# Patient Record
Sex: Female | Born: 1950 | Race: White | Hispanic: No | Marital: Married | Smoking: Never smoker
Health system: Southern US, Community
[De-identification: ages and names within clinical notes are randomized; demographics above are authoritative.]

## PROBLEM LIST (undated history)

## (undated) DIAGNOSIS — R011 Cardiac murmur, unspecified: Secondary | ICD-10-CM

## (undated) DIAGNOSIS — E785 Hyperlipidemia, unspecified: Secondary | ICD-10-CM

## (undated) DIAGNOSIS — R42 Dizziness and giddiness: Secondary | ICD-10-CM

## (undated) DIAGNOSIS — K219 Gastro-esophageal reflux disease without esophagitis: Secondary | ICD-10-CM

## (undated) DIAGNOSIS — I82409 Acute embolism and thrombosis of unspecified deep veins of unspecified lower extremity: Secondary | ICD-10-CM

## (undated) DIAGNOSIS — T7840XA Allergy, unspecified, initial encounter: Secondary | ICD-10-CM

## (undated) DIAGNOSIS — I10 Essential (primary) hypertension: Secondary | ICD-10-CM

## (undated) DIAGNOSIS — I4891 Unspecified atrial fibrillation: Secondary | ICD-10-CM

## (undated) DIAGNOSIS — I2699 Other pulmonary embolism without acute cor pulmonale: Secondary | ICD-10-CM

## (undated) DIAGNOSIS — E042 Nontoxic multinodular goiter: Secondary | ICD-10-CM

## (undated) HISTORY — DX: Allergy, unspecified, initial encounter: T78.40XA

## (undated) HISTORY — DX: Essential (primary) hypertension: I10

## (undated) HISTORY — PX: TONSILLECTOMY: SUR1361

## (undated) HISTORY — DX: Acute embolism and thrombosis of unspecified deep veins of unspecified lower extremity: I82.409

## (undated) HISTORY — DX: Cardiac murmur, unspecified: R01.1

## (undated) HISTORY — DX: Gastro-esophageal reflux disease without esophagitis: K21.9

## (undated) HISTORY — DX: Hyperlipidemia, unspecified: E78.5

## (undated) HISTORY — DX: Nontoxic multinodular goiter: E04.2

## (undated) HISTORY — DX: Other pulmonary embolism without acute cor pulmonale: I26.99

---

## 2003-03-18 ENCOUNTER — Encounter: Payer: Self-pay | Admitting: Cardiovascular Disease

## 2003-03-18 ENCOUNTER — Ambulatory Visit (HOSPITAL_COMMUNITY): Admission: RE | Admit: 2003-03-18 | Discharge: 2003-03-18 | Payer: Self-pay | Admitting: Cardiovascular Disease

## 2003-12-06 ENCOUNTER — Ambulatory Visit (HOSPITAL_COMMUNITY): Admission: RE | Admit: 2003-12-06 | Discharge: 2003-12-06 | Payer: Self-pay | Admitting: Cardiovascular Disease

## 2004-11-22 ENCOUNTER — Ambulatory Visit: Payer: Self-pay | Admitting: Family Medicine

## 2005-09-09 ENCOUNTER — Ambulatory Visit: Payer: Self-pay | Admitting: Family Medicine

## 2005-10-23 ENCOUNTER — Ambulatory Visit: Payer: Self-pay | Admitting: Internal Medicine

## 2005-10-29 ENCOUNTER — Ambulatory Visit: Payer: Self-pay | Admitting: Cardiology

## 2005-11-05 ENCOUNTER — Ambulatory Visit: Payer: Self-pay | Admitting: Internal Medicine

## 2005-11-25 HISTORY — PX: COLONOSCOPY: SHX5424

## 2006-04-10 ENCOUNTER — Ambulatory Visit: Payer: Self-pay | Admitting: Family Medicine

## 2007-05-11 ENCOUNTER — Ambulatory Visit: Payer: Self-pay | Admitting: Family Medicine

## 2007-07-03 ENCOUNTER — Ambulatory Visit: Payer: Self-pay | Admitting: Gastroenterology

## 2008-05-09 ENCOUNTER — Ambulatory Visit: Payer: Self-pay | Admitting: Family Medicine

## 2009-05-12 ENCOUNTER — Ambulatory Visit: Payer: Self-pay | Admitting: Family Medicine

## 2009-07-07 HISTORY — PX: DOPPLER ECHOCARDIOGRAPHY: SHX263

## 2009-07-07 HISTORY — PX: TRANSTHORACIC ECHOCARDIOGRAM: SHX275

## 2009-07-07 HISTORY — PX: NM MYOVIEW LTD: HXRAD82

## 2010-05-31 ENCOUNTER — Ambulatory Visit: Payer: Self-pay | Admitting: Family Medicine

## 2011-09-05 ENCOUNTER — Ambulatory Visit: Payer: Self-pay | Admitting: Family Medicine

## 2012-10-05 ENCOUNTER — Ambulatory Visit: Payer: Self-pay | Admitting: Family Medicine

## 2012-10-12 ENCOUNTER — Ambulatory Visit: Payer: Self-pay | Admitting: Family Medicine

## 2012-10-26 ENCOUNTER — Ambulatory Visit: Payer: Self-pay | Admitting: Surgery

## 2012-10-26 LAB — PROTIME-INR
INR: 0.9
Prothrombin Time: 12.3 secs (ref 11.5–14.7)

## 2012-10-26 LAB — CBC
MCHC: 32.7 g/dL (ref 32.0–36.0)
Platelet: 185 10*3/uL (ref 150–440)
RDW: 13 % (ref 11.5–14.5)
WBC: 6.8 10*3/uL (ref 3.6–11.0)

## 2012-10-28 ENCOUNTER — Ambulatory Visit: Payer: Self-pay | Admitting: Surgery

## 2012-10-28 HISTORY — PX: BREAST BIOPSY: SHX20

## 2013-04-22 ENCOUNTER — Ambulatory Visit: Payer: Self-pay | Admitting: Surgery

## 2013-04-26 ENCOUNTER — Telehealth: Payer: Self-pay | Admitting: Cardiovascular Disease

## 2013-04-26 NOTE — Telephone Encounter (Signed)
Angela Sparks is calling because she needs authorization on her medication . The pharmacy states that she needs to get an appointment in order to get the Clotidogrel 75mg  and Simvastatin 40mg  refill and is completely out of her blood thinner medication . Pharmacy [facility] [place] [phone] and if any questions please call her @ [phone].   Please call her when submitted to pharmacy . She wants to speak to a nurse.   Thanks

## 2013-04-27 MED ORDER — CLOPIDOGREL BISULFATE 75 MG PO TABS: 75.0000 mg | ORAL_TABLET | Freq: Every day | ORAL | Status: DC

## 2013-04-27 MED ORDER — SIMVASTATIN 40 MG PO TABS: 40.0000 mg | ORAL_TABLET | Freq: Every day | ORAL | Status: DC

## 2013-04-27 NOTE — Telephone Encounter (Signed)
Pt called back and informed she will need an appt for further refills.  She has not been seen since 2012 and refills cannot continue to be given w/o an appt.  Pt verbalized understanding and agreed w/ plan.  Attempted to schedule an appt, but pt has a past due balance and had to speak w/ financial counselor.  Pt transferred and informed RN will try to get a refill until July (requested appt month), but cannot guarantee.  Pt verbalized understanding and agreed w/ plan.  JC, LPN w/ Dr. Alanda Amass notified and advised a 30-day Rx w/o refills.  PT MUST BE SEEN.  Will call pt to inform.

## 2013-04-27 NOTE — Telephone Encounter (Signed)
Returned call.  Left message to call back before 4pm.  

## 2013-04-27 NOTE — Telephone Encounter (Signed)
Refill(s) sent to pharmacy.  Call to pt and informed.  Pt verbalized understanding and agreed w/ plan.  Will call back after balance paid to schedule appt and refills will be authorized until appt.

## 2013-05-27 ENCOUNTER — Other Ambulatory Visit: Payer: Self-pay | Admitting: *Deleted

## 2013-05-27 DIAGNOSIS — R011 Cardiac murmur, unspecified: Secondary | ICD-10-CM

## 2013-05-27 DIAGNOSIS — Z86718 Personal history of other venous thrombosis and embolism: Secondary | ICD-10-CM

## 2013-05-27 DIAGNOSIS — I272 Pulmonary hypertension, unspecified: Secondary | ICD-10-CM

## 2013-05-27 DIAGNOSIS — Z86711 Personal history of pulmonary embolism: Secondary | ICD-10-CM

## 2013-06-04 ENCOUNTER — Encounter: Payer: Self-pay | Admitting: Cardiovascular Disease

## 2013-06-16 ENCOUNTER — Ambulatory Visit (HOSPITAL_COMMUNITY)
Admission: RE | Admit: 2013-06-16 | Discharge: 2013-06-16 | Disposition: A | Discharge status: Home / Self Care | Payer: BC Managed Care – PPO | Source: Ambulatory Visit | Attending: Cardiovascular Disease | Admitting: Cardiovascular Disease

## 2013-06-16 DIAGNOSIS — R011 Cardiac murmur, unspecified: Secondary | ICD-10-CM

## 2013-06-16 DIAGNOSIS — Z86718 Personal history of other venous thrombosis and embolism: Secondary | ICD-10-CM

## 2013-06-16 DIAGNOSIS — Z7982 Long term (current) use of aspirin: Secondary | ICD-10-CM | POA: Insufficient documentation

## 2013-06-16 DIAGNOSIS — Z86711 Personal history of pulmonary embolism: Secondary | ICD-10-CM | POA: Insufficient documentation

## 2013-06-16 DIAGNOSIS — I272 Pulmonary hypertension, unspecified: Secondary | ICD-10-CM

## 2013-06-16 DIAGNOSIS — Z7902 Long term (current) use of antithrombotics/antiplatelets: Secondary | ICD-10-CM | POA: Insufficient documentation

## 2013-06-16 NOTE — Progress Notes (Signed)
2D Echo Performed 06/16/2013    Arby Dahir, RCS  

## 2013-06-16 NOTE — Progress Notes (Signed)
Venous Duplex Lower Ext. Completed. Delvonte Berenson, RDMS, RVT  

## 2013-09-30 ENCOUNTER — Other Ambulatory Visit: Payer: Self-pay

## 2013-10-06 ENCOUNTER — Ambulatory Visit: Payer: Self-pay | Admitting: Surgery

## 2013-10-15 ENCOUNTER — Ambulatory Visit: Payer: Self-pay | Admitting: Surgery

## 2013-10-15 HISTORY — PX: BREAST BIOPSY: SHX20

## 2013-10-18 LAB — PATHOLOGY REPORT

## 2014-05-25 ENCOUNTER — Other Ambulatory Visit: Payer: Self-pay

## 2014-05-25 MED ORDER — LOSARTAN POTASSIUM 50 MG PO TABS: 50.0000 mg | ORAL_TABLET | Freq: Every day | ORAL | Status: AC

## 2014-05-25 MED ORDER — CLOPIDOGREL BISULFATE 75 MG PO TABS: 75.0000 mg | ORAL_TABLET | Freq: Every day | ORAL | Status: DC

## 2014-05-25 MED ORDER — SIMVASTATIN 40 MG PO TABS: 40.0000 mg | ORAL_TABLET | Freq: Every day | ORAL | Status: DC

## 2014-05-25 MED ORDER — METOPROLOL SUCCINATE ER 25 MG PO TB24: 25.0000 mg | ORAL_TABLET | Freq: Every day | ORAL | Status: AC

## 2014-05-25 NOTE — Telephone Encounter (Signed)
Rx was sent to pharmacy electronically. Patient's last office visit - 05/27/13 with Dr Rollene Fare.

## 2014-05-26 ENCOUNTER — Other Ambulatory Visit: Payer: Self-pay | Admitting: *Deleted

## 2014-05-26 MED ORDER — SIMVASTATIN 40 MG PO TABS: 40.0000 mg | ORAL_TABLET | Freq: Every day | ORAL | Status: DC

## 2014-05-26 MED ORDER — CLOPIDOGREL BISULFATE 75 MG PO TABS: 75.0000 mg | ORAL_TABLET | Freq: Every day | ORAL | Status: AC

## 2014-05-26 NOTE — Telephone Encounter (Signed)
Rx was sent to pharmacy electronically. Last OV 05/2013 - Dr. Rollene Fare

## 2014-06-29 DIAGNOSIS — I10 Essential (primary) hypertension: Secondary | ICD-10-CM | POA: Insufficient documentation

## 2014-06-29 DIAGNOSIS — I82409 Acute embolism and thrombosis of unspecified deep veins of unspecified lower extremity: Secondary | ICD-10-CM | POA: Insufficient documentation

## 2014-06-29 DIAGNOSIS — E785 Hyperlipidemia, unspecified: Secondary | ICD-10-CM | POA: Insufficient documentation

## 2014-10-17 ENCOUNTER — Ambulatory Visit: Payer: Self-pay | Admitting: Surgery

## 2015-06-06 ENCOUNTER — Encounter: Payer: Self-pay | Admitting: *Deleted

## 2015-07-12 ENCOUNTER — Encounter: Payer: Self-pay | Admitting: Cardiovascular Disease

## 2015-10-12 ENCOUNTER — Other Ambulatory Visit: Payer: Self-pay | Admitting: Family Medicine

## 2016-02-02 ENCOUNTER — Ambulatory Visit (INDEPENDENT_AMBULATORY_CARE_PROVIDER_SITE_OTHER): Payer: BC Managed Care – PPO | Admitting: Family Medicine

## 2016-02-02 ENCOUNTER — Telehealth: Payer: Self-pay | Admitting: Surgery

## 2016-02-02 ENCOUNTER — Encounter: Payer: Self-pay | Admitting: Family Medicine

## 2016-02-02 VITALS — BP 110/80 | HR 100 | Temp 99.2°F | Ht 66.0 in | Wt 172.0 lb

## 2016-02-02 DIAGNOSIS — J0110 Acute frontal sinusitis, unspecified: Secondary | ICD-10-CM | POA: Diagnosis not present

## 2016-02-02 DIAGNOSIS — Z1231 Encounter for screening mammogram for malignant neoplasm of breast: Secondary | ICD-10-CM | POA: Diagnosis not present

## 2016-02-02 DIAGNOSIS — J40 Bronchitis, not specified as acute or chronic: Secondary | ICD-10-CM

## 2016-02-02 DIAGNOSIS — R509 Fever, unspecified: Secondary | ICD-10-CM | POA: Diagnosis not present

## 2016-02-02 LAB — POCT INFLUENZA A/B
Influenza A, POC: NEGATIVE
Influenza B, POC: NEGATIVE

## 2016-02-02 MED ORDER — AZITHROMYCIN 250 MG PO TABS: ORAL_TABLET | ORAL | Status: DC

## 2016-02-02 MED ORDER — BENZONATATE 100 MG PO CAPS: 100.0000 mg | ORAL_CAPSULE | Freq: Two times a day (BID) | ORAL | Status: DC | PRN

## 2016-02-02 NOTE — Telephone Encounter (Signed)
Made call to [facility] [phone]. Spoke with Baxter Flattery. Explained that patient had Mammogram ordered by Dr. Rexene Edison in 09/2014 and then refused to come back in for follow-up office visit afterwards. She was told at that time that she would have to go back to PCP for further Mammogram orders and did verbalize understanding of this conversation.

## 2016-02-02 NOTE — Addendum Note (Signed)
Addended by: Fredderick Severance on: 02/02/2016 04:25 PM   Modules accepted: Orders

## 2016-02-02 NOTE — Progress Notes (Signed)
Name: Angela Sparks   MRN: [id number]    DOB: [birth year]   Date:02/02/2016       Progress Note  Subjective  Chief Complaint  Chief Complaint  Patient presents with  . Sinusitis    cough and cong, fever, weak, sob    Sinusitis This is a new problem. The current episode started in the past 7 days. The problem has been waxing and waning since onset. The maximum temperature recorded prior to her arrival was 101 - 101.9 F. The pain is mild. Associated symptoms include chills, congestion, coughing, diaphoresis, headaches and sinus pressure. Pertinent negatives include no ear pain, hoarse voice, neck pain, shortness of breath, sneezing, sore throat or swollen glands. (Frontal headache) Past treatments include acetaminophen. The treatment provided no relief.  Cough This is a new problem. The current episode started in the past 7 days. The problem has been waxing and waning. The cough is non-productive. Associated symptoms include chills and headaches. Pertinent negatives include no chest pain, ear pain, fever, heartburn, myalgias, rash, sore throat, shortness of breath, weight loss or wheezing. The treatment provided no relief. Her past medical history is significant for asthma. There is no history of environmental allergies.    No problem-specific assessment & plan notes found for this encounter.   Past Medical History  Diagnosis Date  . Hypertension   . Hyperlipidemia   . DVT (deep venous thrombosis) ([place])   . PE (pulmonary embolism)   . Multiple thyroid nodules     Past Surgical History  Procedure Laterality Date  . Nm myoview ltd  07/07/2009  . Doppler echocardiography  07/07/2009  . Transthoracic echocardiogram  07/07/2009  . Colonoscopy  2007    Family History  Problem Relation Age of Onset  . Family history unknown: Yes    Social History   Social History  . Marital Status: Married    Spouse Name: N/A  . Number of Children: N/A  . Years of Education: N/A   Occupational  History  . Not on file.   Social History Main Topics  . Smoking status: Never Smoker   . Smokeless tobacco: Not on file  . Alcohol Use: No  . Drug Use: No  . Sexual Activity: Not on file   Other Topics Concern  . Not on file   Social History Narrative    Allergies  Allergen Reactions  . Codeine Other (See Comments)     Review of Systems  Constitutional: Positive for chills and diaphoresis. Negative for fever, weight loss and malaise/fatigue.  HENT: Positive for congestion and sinus pressure. Negative for ear discharge, ear pain, hoarse voice, sneezing and sore throat.   Eyes: Negative for blurred vision.  Respiratory: Positive for cough. Negative for sputum production, shortness of breath and wheezing.   Cardiovascular: Negative for chest pain, palpitations and leg swelling.  Gastrointestinal: Negative for heartburn, nausea, abdominal pain, diarrhea, constipation, blood in stool and melena.  Genitourinary: Negative for dysuria, urgency, frequency and hematuria.  Musculoskeletal: Negative for myalgias, back pain, joint pain and neck pain.  Skin: Negative for rash.  Neurological: Positive for headaches. Negative for dizziness, tingling, sensory change and focal weakness.  Endo/Heme/Allergies: Negative for environmental allergies and polydipsia. Does not bruise/bleed easily.  Psychiatric/Behavioral: Negative for depression and suicidal ideas. The patient is not nervous/anxious and does not have insomnia.      Objective  Filed Vitals:   02/02/16 1354  BP: 110/80  Pulse: 100  Temp: 99.2 F (37.3 C)  TempSrc: Oral  Height: 5\' 6"  (1.676 m)  Weight: 172 lb (78.019 kg)    Physical Exam  Constitutional: She is well-developed, well-nourished, and in no distress. No distress.  HENT:  Head: Normocephalic and atraumatic.  Right Ear: External ear normal.  Left Ear: External ear normal.  Nose: Nose normal.  Mouth/Throat: Oropharynx is clear and moist.  Eyes: Conjunctivae  and EOM are normal. Pupils are equal, round, and reactive to light. Right eye exhibits no discharge. Left eye exhibits no discharge.  Neck: Normal range of motion. Neck supple. No JVD present. No thyromegaly present.  Cardiovascular: Normal rate, regular rhythm, normal heart sounds and intact distal pulses.  Exam reveals no gallop and no friction rub.   No murmur heard. Pulmonary/Chest: Effort normal and breath sounds normal.  Abdominal: Soft. Bowel sounds are normal. She exhibits no mass. There is no tenderness. There is no guarding.  Musculoskeletal: Normal range of motion. She exhibits no edema.  Lymphadenopathy:    She has no cervical adenopathy.  Neurological: She is alert. She has normal reflexes.  Skin: Skin is warm and dry. She is not diaphoretic.  Psychiatric: Mood and affect normal.  Nursing note and vitals reviewed.     Assessment & Plan  Problem List Items Addressed This Visit    None    Visit Diagnoses    Bronchitis    -  Primary    Relevant Medications    azithromycin (ZITHROMAX) 250 MG tablet    benzonatate (TESSALON) 100 MG capsule    Acute frontal sinusitis, recurrence not specified        Relevant Medications    azithromycin (ZITHROMAX) 250 MG tablet    benzonatate (TESSALON) 100 MG capsule    Fever and chills        Relevant Orders    POCT Influenza A/B (Completed)      Pt will be contacted by Dr Antionette Char office concerning her mammogram. She is to pick up otc Omega 3 and take 1,200mg  BID as well as continue vitamin D 2,000 units daily and will have labs rechecked fasting in 6 to 8 weeks  Dr. [facility]  02/02/2016

## 2016-02-02 NOTE — Telephone Encounter (Signed)
Angela Sparks called from [facility] and stated that the patient did not get a mammogram notification for 2016. She said Dr. Burt Knack did a couple of biopsy's in the past. Angela Sparks wants to know who should be making sure she gets her mammogram?

## 2016-04-02 ENCOUNTER — Telehealth: Payer: Self-pay

## 2016-04-02 ENCOUNTER — Ambulatory Visit (INDEPENDENT_AMBULATORY_CARE_PROVIDER_SITE_OTHER): Payer: BC Managed Care – PPO | Admitting: Family Medicine

## 2016-04-02 ENCOUNTER — Encounter: Payer: Self-pay | Admitting: Family Medicine

## 2016-04-02 VITALS — BP 120/80 | HR 58 | Ht 66.0 in | Wt 173.0 lb

## 2016-04-02 DIAGNOSIS — Z1239 Encounter for other screening for malignant neoplasm of breast: Secondary | ICD-10-CM

## 2016-04-02 DIAGNOSIS — S46911A Strain of unspecified muscle, fascia and tendon at shoulder and upper arm level, right arm, initial encounter: Secondary | ICD-10-CM | POA: Diagnosis not present

## 2016-04-02 MED ORDER — TRAMADOL HCL 50 MG PO TABS: 50.0000 mg | ORAL_TABLET | Freq: Three times a day (TID) | ORAL | Status: DC | PRN

## 2016-04-02 MED ORDER — MELOXICAM 15 MG PO TABS: 15.0000 mg | ORAL_TABLET | Freq: Every day | ORAL | Status: DC

## 2016-04-02 NOTE — Progress Notes (Signed)
Name: Angela Sparks   MRN: [id number]    DOB: 06/09/1951   Date:04/02/2016       Progress Note  Subjective  Chief Complaint  Chief Complaint  Patient presents with  . Shoulder Pain    woke up Saturday am with pain- slept with grandchild the night before and reached around behind her to push the child away from her- woke up with the pain. Nothing otc seems to help. Starts at top of R) shoulder and radiates down the arm to elbow.    Shoulder Pain  The pain is present in the right shoulder. This is a new problem. The current episode started in the past 7 days. There has been a history of trauma. The problem occurs daily. The problem has been gradually worsening. The quality of the pain is described as aching. The pain is at a severity of 8/10. The pain is moderate. Associated symptoms include tingling. Pertinent negatives include no fever, inability to bear weight, itching, joint locking, joint swelling, numbness or stiffness. The symptoms are aggravated by activity. She has tried acetaminophen and cold for the symptoms.    No problem-specific assessment & plan notes found for this encounter.   Past Medical History  Diagnosis Date  . Hypertension   . Hyperlipidemia   . DVT (deep venous thrombosis) ([place])   . PE (pulmonary embolism)   . Multiple thyroid nodules     Past Surgical History  Procedure Laterality Date  . Nm myoview ltd  07/07/2009  . Doppler echocardiography  07/07/2009  . Transthoracic echocardiogram  07/07/2009  . Colonoscopy  2007    Family History  Problem Relation Age of Onset  . Family history unknown: Yes    Social History   Social History  . Marital Status: Married    Spouse Name: N/A  . Number of Children: N/A  . Years of Education: N/A   Occupational History  . Not on file.   Social History Main Topics  . Smoking status: Never Smoker   . Smokeless tobacco: Not on file  . Alcohol Use: No  . Drug Use: No  . Sexual Activity: Not on file   Other  Topics Concern  . Not on file   Social History Narrative    Allergies  Allergen Reactions  . Codeine Other (See Comments)     Review of Systems  Constitutional: Negative for fever, chills, weight loss and malaise/fatigue.  HENT: Negative for ear discharge, ear pain and sore throat.   Eyes: Negative for blurred vision.  Respiratory: Negative for cough, sputum production, shortness of breath and wheezing.   Cardiovascular: Negative for chest pain, palpitations and leg swelling.  Gastrointestinal: Negative for heartburn, nausea, abdominal pain, diarrhea, constipation, blood in stool and melena.  Genitourinary: Negative for dysuria, urgency, frequency and hematuria.  Musculoskeletal: Positive for myalgias. Negative for back pain, joint pain, stiffness and neck pain.  Skin: Negative for itching and rash.  Neurological: Positive for tingling. Negative for dizziness, sensory change, focal weakness, numbness and headaches.  Endo/Heme/Allergies: Negative for environmental allergies and polydipsia. Does not bruise/bleed easily.  Psychiatric/Behavioral: Negative for depression and suicidal ideas. The patient is not nervous/anxious and does not have insomnia.      Objective  Filed Vitals:   04/02/16 0917  BP: 120/80  Pulse: 58  Height: 5\' 6"  (1.676 m)  Weight: 173 lb (78.472 kg)    Physical Exam  Constitutional: She is well-developed, well-nourished, and in no distress. No distress.  HENT:  Head:  Normocephalic and atraumatic.  Right Ear: External ear normal.  Left Ear: External ear normal.  Nose: Nose normal.  Mouth/Throat: Oropharynx is clear and moist.  Eyes: Conjunctivae and EOM are normal. Pupils are equal, round, and reactive to light. Right eye exhibits no discharge. Left eye exhibits no discharge.  Neck: Normal range of motion. Neck supple. No JVD present. No thyromegaly present.  Cardiovascular: Normal rate, regular rhythm, normal heart sounds and intact distal pulses.   Exam reveals no gallop and no friction rub.   No murmur heard. Pulmonary/Chest: Effort normal and breath sounds normal.  Abdominal: Soft. Bowel sounds are normal. She exhibits no mass. There is no tenderness. There is no guarding.  Musculoskeletal: Normal range of motion. She exhibits tenderness. She exhibits no edema.       Right shoulder: She exhibits tenderness and swelling.  Lymphadenopathy:    She has no cervical adenopathy.  Neurological: She is alert.  Skin: Skin is warm and dry. She is not diaphoretic.  Psychiatric: Mood and affect normal.  Nursing note and vitals reviewed.     Assessment & Plan  Problem List Items Addressed This Visit    None    Visit Diagnoses    Strain of shoulder, right, initial encounter    -  Primary    codeine made peaple look green    Relevant Medications    meloxicam (MOBIC) 15 MG tablet    traMADol (ULTRAM) 50 MG tablet    Breast cancer screening          Call into Dr Burt Knack concerning ordering a mammogram   Dr. [facility]  04/02/2016

## 2016-04-02 NOTE — Patient Instructions (Signed)
Shoulder Range of Motion Exercises Shoulder range of motion (ROM) exercises are designed to keep the shoulder moving freely. They are often recommended for people who have shoulder pain. MOVEMENT EXERCISE When you are able, do this exercise 5-6 days per week, or as told by your health care provider. Work toward doing 2 sets of 10 swings. Pendulum Exercise How To Do This Exercise Lying Down 1. Lie face-down on a bed with your abdomen close to the side of the bed. 2. Let your arm hang over the side of the bed. 3. Relax your shoulder, arm, and hand. 4. Slowly and gently swing your arm forward and back. Do not use your neck muscles to swing your arm. They should be relaxed. If you are struggling to swing your arm, have someone gently swing it for you. When you do this exercise for the first time, swing your arm at a 15 degree angle for 15 seconds, or swing your arm 10 times. As pain lessens over time, increase the angle of the swing to 30-45 degrees. 5. Repeat steps 1-4 with the other arm. How To Do This Exercise While Standing 1. Stand next to a sturdy chair or table and hold on to it with your hand.  Bend forward at the waist.  Bend your knees slightly.  Relax your other arm and let it hang limp.  Relax the shoulder blade of the arm that is hanging and let it drop.  While keeping your shoulder relaxed, use body motion to swing your arm in small circles. The first time you do this exercise, swing your arm for about 30 seconds or 10 times. When you do it next time, swing your arm for a little longer.  Stand up tall and relax.  Repeat steps 1-7, this time changing the direction of the circles. 2. Repeat steps 1-8 with the other arm. STRETCHING EXERCISES Do these exercises 3-4 times per day on 5-6 days per week or as told by your health care provider. Work toward holding the stretch for 20 seconds. Stretching Exercise 1 1. Lift your arm straight out in front of you. 2. Bend your arm 90  degrees at the elbow (right angle) so your forearm goes across your body and looks like the letter "L." 3. Use your other arm to gently pull the elbow forward and across your body. 4. Repeat steps 1-3 with the other arm. Stretching Exercise 2 You will need a towel or rope for this exercise. 1. Bend one arm behind your back with the palm facing outward. 2. Hold a towel with your other hand. 3. Reach the arm that holds the towel above your head, and bend that arm at the elbow. Your wrist should be behind your neck. 4. Use your free hand to grab the free end of the towel. 5. With the higher hand, gently pull the towel up behind you. 6. With the lower hand, pull the towel down behind you. 7. Repeat steps 1-6 with the other arm. STRENGTHENING EXERCISES Do each of these exercises at four different times of day (sessions) every day or as told by your health care provider. To begin with, repeat each exercise 5 times (repetitions). Work toward doing 3 sets of 12 repetitions or as told by your health care provider. Strengthening Exercise 1 You will need a light weight for this activity. As you grow stronger, you may use a heavier weight. 1. Standing with a weight in your hand, lift your arm straight out to the side   until it is at the same height as your shoulder. 2. Bend your arm at 90 degrees so that your fingers are pointing to the ceiling. 3. Slowly raise your hand until your arm is straight up in the air. 4. Repeat steps 1-3 with the other arm. Strengthening Exercise 2 You will need a light weight for this activity. As you grow stronger, you may use a heavier weight. 1. Standing with a weight in your hand, gradually move your straight arm in an arc, starting at your side, then out in front of you, then straight up over your head. 2. Gradually move your other arm in an arc, starting at your side, then out in front of you, then straight up over your head. 3. Repeat steps 1-2 with the other  arm. Strengthening Exercise 3 You will need an elastic band for this activity. As you grow stronger, gradually increase the size of the bands or increase the number of bands that you use at one time. 1. While standing, hold an elastic band in one hand and raise that arm up in the air. 2. With your other hand, pull down the band until that hand is by your side. 3. Repeat steps 1-2 with the other arm.   This information is not intended to replace advice given to you by your health care provider. Make sure you discuss any questions you have with your health care provider.   Document Released: 08/10/2003 Document Revised: 03/28/2015 Document Reviewed: 11/07/2014 Elsevier Interactive Patient Education 2016 [employer]  

## 2016-04-02 NOTE — Telephone Encounter (Signed)
Spoke with Baxter Flattery at [facility] at this time. Explained once again that patient refused to follow-up after her last mammogram and that this would be the responsibility of her PCP.

## 2016-04-02 NOTE — Telephone Encounter (Signed)
Please call Baxter Flattery at [facility] today at [phone] regarding ordering a Mammogram.

## 2016-04-18 ENCOUNTER — Ambulatory Visit
Admission: RE | Admit: 2016-04-18 | Discharge: 2016-04-18 | Disposition: A | Discharge status: Home / Self Care | Payer: BC Managed Care – PPO | Source: Ambulatory Visit | Attending: Family Medicine | Admitting: Family Medicine

## 2016-04-18 DIAGNOSIS — Z1231 Encounter for screening mammogram for malignant neoplasm of breast: Secondary | ICD-10-CM | POA: Diagnosis present

## 2016-04-29 ENCOUNTER — Other Ambulatory Visit: Payer: Self-pay | Admitting: Family Medicine

## 2016-09-12 ENCOUNTER — Ambulatory Visit (INDEPENDENT_AMBULATORY_CARE_PROVIDER_SITE_OTHER): Payer: BC Managed Care – PPO | Admitting: Family Medicine

## 2016-09-12 ENCOUNTER — Encounter: Payer: Self-pay | Admitting: Family Medicine

## 2016-09-12 VITALS — BP 102/90 | HR 89 | Ht 66.0 in | Wt 168.0 lb

## 2016-09-12 DIAGNOSIS — J40 Bronchitis, not specified as acute or chronic: Secondary | ICD-10-CM

## 2016-09-12 DIAGNOSIS — M545 Low back pain: Secondary | ICD-10-CM

## 2016-09-12 DIAGNOSIS — R05 Cough: Secondary | ICD-10-CM | POA: Diagnosis not present

## 2016-09-12 DIAGNOSIS — M519 Unspecified thoracic, thoracolumbar and lumbosacral intervertebral disc disorder: Secondary | ICD-10-CM

## 2016-09-12 MED ORDER — PREDNISONE 10 MG PO TABS: 10.0000 mg | ORAL_TABLET | Freq: Every day | ORAL | 0 refills | Status: DC

## 2016-09-12 MED ORDER — GUAIFENESIN-CODEINE 100-10 MG/5ML PO SYRP: 5.0000 mL | ORAL_SOLUTION | Freq: Three times a day (TID) | ORAL | 0 refills | Status: DC | PRN

## 2016-09-12 MED ORDER — CYCLOBENZAPRINE HCL 10 MG PO TABS: 10.0000 mg | ORAL_TABLET | Freq: Three times a day (TID) | ORAL | 0 refills | Status: DC | PRN

## 2016-09-12 MED ORDER — AMOXICILLIN-POT CLAVULANATE 875-125 MG PO TABS: 1.0000 | ORAL_TABLET | Freq: Two times a day (BID) | ORAL | 0 refills | Status: DC

## 2016-09-12 NOTE — Progress Notes (Signed)
Name: Angela Sparks   MRN: [id number]    DOB: [birth year]   Date:09/12/2016       Progress Note  Subjective  Chief Complaint  Chief Complaint  Patient presents with  . Cough    was taking tessalon perles- no relief. throat is sore when cough, and stomach muscles hurt from coughing. yellow/green production.   . Back Pain    stepped in hole and twisted back    Cough  This is a new problem. The current episode started in the past 7 days. The problem has been gradually worsening. The problem occurs every few minutes. The cough is productive of purulent sputum (yellow/green). Associated symptoms include chills, a fever, myalgias, nasal congestion, postnasal drip and wheezing. Pertinent negatives include no chest pain, ear congestion, ear pain, headaches, heartburn, hemoptysis, rash, rhinorrhea, sore throat, shortness of breath, sweats or weight loss. Nothing aggravates the symptoms. She has tried nothing for the symptoms. The treatment provided mild relief. Her past medical history is significant for bronchitis. There is no history of asthma, bronchiectasis, COPD, emphysema, environmental allergies or pneumonia.  Back Pain  This is a new problem. The current episode started in the past 7 days. The problem occurs daily. The problem has been waxing and waning since onset. The pain is present in the lumbar spine. The pain does not radiate. The pain is mild. The symptoms are aggravated by bending, twisting, sitting and coughing. Associated symptoms include a fever. Pertinent negatives include no abdominal pain, bladder incontinence, bowel incontinence, chest pain, dysuria, headaches, paresis, tingling, weakness or weight loss. She has tried nothing for the symptoms.    No problem-specific Assessment & Plan notes found for this encounter.   Past Medical History:  Diagnosis Date  . DVT (deep venous thrombosis) ([place])   . Hyperlipidemia   . Hypertension   . Multiple thyroid nodules   . PE (pulmonary  embolism)     Past Surgical History:  Procedure Laterality Date  . BREAST BIOPSY Right 10/28/12   [place]  bx-neg  . BREAST BIOPSY Right 10/15/13   [place] bx/clip-neg  . COLONOSCOPY  2007  . DOPPLER ECHOCARDIOGRAPHY  07/07/2009  . NM MYOVIEW LTD  07/07/2009  . TRANSTHORACIC ECHOCARDIOGRAM  07/07/2009    Family History  Problem Relation Age of Onset  . Breast cancer Neg Hx     Social History   Social History  . Marital status: Married    Spouse name: N/A  . Number of children: N/A  . Years of education: N/A   Occupational History  . Not on file.   Social History Main Topics  . Smoking status: Never Smoker  . Smokeless tobacco: Never Used  . Alcohol use No  . Drug use: No  . Sexual activity: Not on file   Other Topics Concern  . Not on file   Social History Narrative  . No narrative on file    Allergies  Allergen Reactions  . Codeine Other (See Comments)     Review of Systems  Constitutional: Positive for chills and fever. Negative for malaise/fatigue and weight loss.  HENT: Positive for postnasal drip. Negative for ear discharge, ear pain, rhinorrhea and sore throat.   Eyes: Negative for blurred vision.  Respiratory: Positive for cough and wheezing. Negative for hemoptysis, sputum production and shortness of breath.   Cardiovascular: Negative for chest pain, palpitations and leg swelling.  Gastrointestinal: Negative for abdominal pain, blood in stool, bowel incontinence, constipation, diarrhea, heartburn, melena and nausea.  Genitourinary:  Negative for bladder incontinence, dysuria, frequency, hematuria and urgency.  Musculoskeletal: Positive for back pain and myalgias. Negative for joint pain and neck pain.  Skin: Negative for rash.  Neurological: Negative for dizziness, tingling, sensory change, focal weakness, weakness and headaches.  Endo/Heme/Allergies: Negative for environmental allergies and polydipsia. Does not bruise/bleed easily.  Psychiatric/Behavioral:  Negative for depression and suicidal ideas. The patient is not nervous/anxious and does not have insomnia.      Objective  Vitals:   09/12/16 1524  BP: 102/90  Pulse: 89  Weight: 168 lb (76.2 kg)  Height: 5\' 6"  (1.676 m)    Physical Exam  Constitutional: She is well-developed, well-nourished, and in no distress. No distress.  HENT:  Head: Normocephalic and atraumatic.  Right Ear: External ear normal.  Left Ear: External ear normal.  Nose: Nose normal.  Mouth/Throat: Oropharynx is clear and moist.  Eyes: Conjunctivae and EOM are normal. Pupils are equal, round, and reactive to light. Right eye exhibits no discharge. Left eye exhibits no discharge.  Neck: Normal range of motion. Neck supple. No JVD present. No thyromegaly present.  Cardiovascular: Normal rate, regular rhythm, normal heart sounds and intact distal pulses.  Exam reveals no gallop and no friction rub.   No murmur heard. Pulmonary/Chest: Effort normal and breath sounds normal. She has no wheezes. She has no rales.  Abdominal: Soft. Bowel sounds are normal. She exhibits no mass. There is no tenderness. There is no guarding.  Musculoskeletal: Normal range of motion. She exhibits no edema.       Lumbar back: She exhibits spasm. She exhibits no tenderness and no bony tenderness.  Lymphadenopathy:    She has no cervical adenopathy.  Neurological: She is alert. She has normal reflexes.  Skin: Skin is warm and dry. She is not diaphoretic.  Psychiatric: Mood and affect normal.  Nursing note and vitals reviewed.     Assessment & Plan  Problem List Items Addressed This Visit    None    Visit Diagnoses    Bronchitis    -  Primary   Relevant Medications   amoxicillin-clavulanate (AUGMENTIN) 875-125 MG tablet   guaiFENesin-codeine (ROBITUSSIN AC) 100-10 MG/5ML syrup   predniSONE (DELTASONE) 10 MG tablet   Lumbar disc disorder       aleve   Relevant Medications   cyclobenzaprine (FLEXERIL) 10 MG tablet   predniSONE  (DELTASONE) 10 MG tablet    I spent 15 minutes with this patient, More than 50% of that time was spent in face to face education, counseling and care coordination.    Dr. Macon [facility]  09/12/16

## 2017-03-12 ENCOUNTER — Other Ambulatory Visit: Payer: Self-pay | Admitting: Family Medicine

## 2017-03-12 DIAGNOSIS — Z1231 Encounter for screening mammogram for malignant neoplasm of breast: Secondary | ICD-10-CM

## 2017-04-22 ENCOUNTER — Ambulatory Visit
Admission: RE | Admit: 2017-04-22 | Discharge: 2017-04-22 | Disposition: A | Discharge status: Home / Self Care | Payer: Medicare Other | Source: Ambulatory Visit | Attending: Family Medicine | Admitting: Family Medicine

## 2017-04-22 DIAGNOSIS — Z1231 Encounter for screening mammogram for malignant neoplasm of breast: Secondary | ICD-10-CM | POA: Insufficient documentation

## 2017-10-07 ENCOUNTER — Encounter: Payer: Self-pay | Admitting: Family Medicine

## 2017-10-07 ENCOUNTER — Ambulatory Visit: Payer: Medicare Other | Admitting: Family Medicine

## 2017-10-07 VITALS — BP 130/80 | HR 100 | Temp 98.5°F | Ht 66.0 in | Wt 171.0 lb

## 2017-10-07 DIAGNOSIS — J0100 Acute maxillary sinusitis, unspecified: Secondary | ICD-10-CM | POA: Diagnosis not present

## 2017-10-07 DIAGNOSIS — J40 Bronchitis, not specified as acute or chronic: Secondary | ICD-10-CM

## 2017-10-07 MED ORDER — AZITHROMYCIN 250 MG PO TABS: ORAL_TABLET | ORAL | 1 refills | Status: DC

## 2017-10-07 MED ORDER — BENZONATATE 100 MG PO CAPS: 100.0000 mg | ORAL_CAPSULE | Freq: Two times a day (BID) | ORAL | 0 refills | Status: DC | PRN

## 2017-10-07 NOTE — Progress Notes (Signed)
Name: Angela Sparks   MRN: [id number]    DOB: August 31, 1951   Date:10/07/2017       Progress Note  Subjective  Chief Complaint  Chief Complaint  Patient presents with  . Sinusitis    fever, chills, sore throat    Sinusitis  This is a new problem. The current episode started in the past 7 days (saturday). The problem has been gradually worsening since onset. The maximum temperature recorded prior to her arrival was 100.4 - 100.9 F. The fever has been present for 1 to 2 days. Associated symptoms include chills, congestion, coughing, diaphoresis, ear pain, headaches, sinus pressure, sneezing and a sore throat. Pertinent negatives include no hoarse voice, neck pain, shortness of breath or swollen glands. Past treatments include acetaminophen. The treatment provided no relief.    No problem-specific Assessment & Plan notes found for this encounter.   Past Medical History:  Diagnosis Date  . DVT (deep venous thrombosis) ([place])   . Hyperlipidemia   . Hypertension   . Multiple thyroid nodules   . PE (pulmonary embolism)     Past Surgical History:  Procedure Laterality Date  . BREAST BIOPSY Right 10/28/12   [place]  bx-neg  . BREAST BIOPSY Right 10/15/13   [place] bx/clip-neg  . COLONOSCOPY  2007  . DOPPLER ECHOCARDIOGRAPHY  07/07/2009  . NM MYOVIEW LTD  07/07/2009  . TRANSTHORACIC ECHOCARDIOGRAM  07/07/2009    Family History  Problem Relation Age of Onset  . Breast cancer Neg Hx     Social History   Socioeconomic History  . Marital status: Married    Spouse name: Not on file  . Number of children: Not on file  . Years of education: Not on file  . Highest education level: Not on file  Social Needs  . Financial resource strain: Not on file  . Food insecurity - worry: Not on file  . Food insecurity - inability: Not on file  . Transportation needs - medical: Not on file  . Transportation needs - non-medical: Not on file  Occupational History  . Not on file  Tobacco Use  . Smoking  status: Never Smoker  . Smokeless tobacco: Never Used  Substance and Sexual Activity  . Alcohol use: No    Alcohol/week: 0.0 oz  . Drug use: No  . Sexual activity: Not on file  Other Topics Concern  . Not on file  Social History Narrative  . Not on file    Allergies  Allergen Reactions  . Codeine Other (See Comments)    Outpatient Medications Prior to Visit  Medication Sig Dispense Refill  . aspirin EC 81 MG tablet Take 1 tablet by mouth daily.    . Cholecalciferol (VITAMIN D3) 2000 units capsule Take 1 capsule by mouth daily.    . clopidogrel (PLAVIX) 75 MG tablet Take 1 tablet (75 mg total) by mouth daily. NO MORE REFILLS W/O APPT 30 tablet 0  . esomeprazole (NEXIUM) 20 MG capsule Take 1 capsule by mouth 2 (two) times daily. otc    . losartan (COZAAR) 50 MG tablet Take 1 tablet (50 mg total) by mouth daily. NO MORE REFILLS W/O APPT 30 tablet 0  . metoprolol succinate (TOPROL XL) 25 MG 24 hr tablet Take 1 tablet (25 mg total) by mouth daily. NO MORE REFILLS W/O APPT 30 tablet 0  . amoxicillin-clavulanate (AUGMENTIN) 875-125 MG tablet Take 1 tablet by mouth 2 (two) times daily. 20 tablet 0  . cyclobenzaprine (FLEXERIL) 10 MG tablet  Take 1 tablet (10 mg total) by mouth 3 (three) times daily as needed for muscle spasms. 30 tablet 0  . guaiFENesin-codeine (ROBITUSSIN AC) 100-10 MG/5ML syrup Take 5 mLs by mouth 3 (three) times daily as needed for cough. 150 mL 0  . meloxicam (MOBIC) 15 MG tablet TAKE 1 TABLET (15 MG TOTAL) BY MOUTH DAILY. (Patient not taking: Reported on 09/12/2016) 30 tablet 0  . predniSONE (DELTASONE) 10 MG tablet Take 1 tablet (10 mg total) by mouth daily with breakfast. 14 tablet 0  . traMADol (ULTRAM) 50 MG tablet Take 1 tablet (50 mg total) by mouth every 8 (eight) hours as needed. (Patient not taking: Reported on 09/12/2016) 30 tablet 0   No facility-administered medications prior to visit.     Review of Systems  Constitutional: Positive for chills and  diaphoresis. Negative for fever, malaise/fatigue and weight loss.  HENT: Positive for congestion, ear pain, sinus pressure, sneezing and sore throat. Negative for ear discharge and hoarse voice.   Eyes: Negative for blurred vision.  Respiratory: Positive for cough. Negative for sputum production, shortness of breath and wheezing.   Cardiovascular: Negative for chest pain, palpitations and leg swelling.  Gastrointestinal: Negative for abdominal pain, blood in stool, constipation, diarrhea, heartburn, melena and nausea.  Genitourinary: Negative for dysuria, frequency, hematuria and urgency.  Musculoskeletal: Negative for back pain, joint pain, myalgias and neck pain.  Skin: Negative for rash.  Neurological: Positive for headaches. Negative for dizziness, tingling, sensory change and focal weakness.  Endo/Heme/Allergies: Negative for environmental allergies and polydipsia. Does not bruise/bleed easily.  Psychiatric/Behavioral: Negative for depression and suicidal ideas. The patient is not nervous/anxious and does not have insomnia.      Objective  Vitals:   10/07/17 1353  BP: 130/80  Pulse: 100  Temp: 98.5 F (36.9 C)  TempSrc: Oral  Weight: 171 lb (77.6 kg)  Height: 5\' 6"  (1.676 m)    Physical Exam  Constitutional: She is well-developed, well-nourished, and in no distress. No distress.  HENT:  Head: Normocephalic and atraumatic.  Right Ear: External ear normal.  Left Ear: External ear normal.  Nose: Nose normal.  Mouth/Throat: Oropharynx is clear and moist.  Eyes: Conjunctivae and EOM are normal. Pupils are equal, round, and reactive to light. Right eye exhibits no discharge. Left eye exhibits no discharge.  Neck: Normal range of motion. Neck supple. No JVD present. No thyromegaly present.  Cardiovascular: Normal rate, regular rhythm, normal heart sounds and intact distal pulses. Exam reveals no gallop and no friction rub.  No murmur heard. Pulmonary/Chest: Effort normal and  breath sounds normal. She has no wheezes. She has no rales.  Abdominal: Soft. Bowel sounds are normal. She exhibits no mass. There is no tenderness. There is no guarding.  Musculoskeletal: Normal range of motion. She exhibits no edema.  Lymphadenopathy:    She has no cervical adenopathy.  Neurological: She is alert. She has normal reflexes.  Skin: Skin is warm and dry. No rash noted. She is not diaphoretic.  Left leg plaque/otc corticosteroid  Psychiatric: Mood and affect normal.  Nursing note and vitals reviewed.     Assessment & Plan  Problem List Items Addressed This Visit    None    Visit Diagnoses    Acute maxillary sinusitis, recurrence not specified    -  Primary   Relevant Medications   azithromycin (ZITHROMAX) 250 MG tablet   benzonatate (TESSALON) 100 MG capsule   Bronchitis       Relevant Medications  azithromycin (ZITHROMAX) 250 MG tablet   benzonatate (TESSALON) 100 MG capsule      Meds ordered this encounter  Medications  . azithromycin (ZITHROMAX) 250 MG tablet    Sig: 2 tablets today then 1 a day for 4 days    Dispense:  6 tablet    Refill:  1  . benzonatate (TESSALON) 100 MG capsule    Sig: Take 1 capsule (100 mg total) 2 (two) times daily as needed by mouth for cough.    Dispense:  20 capsule    Refill:  0      Dr. Otilio Miu [facility]  10/07/17

## 2017-10-28 ENCOUNTER — Ambulatory Visit: Payer: Medicare Other | Admitting: Family Medicine

## 2017-10-28 ENCOUNTER — Encounter: Payer: Self-pay | Admitting: Family Medicine

## 2017-10-28 VITALS — BP 130/78 | HR 80 | Ht 66.0 in | Wt 170.0 lb

## 2017-10-28 DIAGNOSIS — Z86718 Personal history of other venous thrombosis and embolism: Secondary | ICD-10-CM

## 2017-10-28 DIAGNOSIS — I8002 Phlebitis and thrombophlebitis of superficial vessels of left lower extremity: Secondary | ICD-10-CM

## 2017-10-28 DIAGNOSIS — D229 Melanocytic nevi, unspecified: Secondary | ICD-10-CM

## 2017-10-28 NOTE — Progress Notes (Signed)
Name: Angela Sparks   MRN: [id number]    DOB: May 17, 1951   Date:10/28/2017       Progress Note  Subjective  Chief Complaint  Chief Complaint  Patient presents with  . Follow-up    went to ER x 2 for blood clots in L) leg. Was found on [place]. Was advised to get in with hematology to be checked for this issue. Pt has been wearing compression stockings    Patient present for followup of superficial thrombophlebitis.  Patient had history of DVT in 2003.  See recent venous doppler study. Sister with history of protein s deficiency on [place]. ? Need for protein gene mutation/ protein c & s/ and factor 5 leiden.    No problem-specific Assessment & Plan notes found for this encounter.   Past Medical History:  Diagnosis Date  . DVT (deep venous thrombosis) ([place])   . Hyperlipidemia   . Hypertension   . Multiple thyroid nodules   . PE (pulmonary embolism)     Past Surgical History:  Procedure Laterality Date  . BREAST BIOPSY Right 10/28/12   [place]  bx-neg  . BREAST BIOPSY Right 10/15/13   [place] bx/clip-neg  . COLONOSCOPY  2007  . DOPPLER ECHOCARDIOGRAPHY  07/07/2009  . NM MYOVIEW LTD  07/07/2009  . TRANSTHORACIC ECHOCARDIOGRAM  07/07/2009    Family History  Problem Relation Age of Onset  . Breast cancer Neg Hx     Social History   Socioeconomic History  . Marital status: Married    Spouse name: Not on file  . Number of children: Not on file  . Years of education: Not on file  . Highest education level: Not on file  Social Needs  . Financial resource strain: Not on file  . Food insecurity - worry: Not on file  . Food insecurity - inability: Not on file  . Transportation needs - medical: Not on file  . Transportation needs - non-medical: Not on file  Occupational History  . Not on file  Tobacco Use  . Smoking status: Never Smoker  . Smokeless tobacco: Never Used  Substance and Sexual Activity  . Alcohol use: No    Alcohol/week: 0.0 oz  . Drug use: No  . Sexual activity:  Not on file  Other Topics Concern  . Not on file  Social History Narrative  . Not on file    Allergies  Allergen Reactions  . Codeine Other (See Comments)    Outpatient Medications Prior to Visit  Medication Sig Dispense Refill  . aspirin EC 81 MG tablet Take 1 tablet by mouth daily.    Marland Kitchen azithromycin (ZITHROMAX) 250 MG tablet 2 tablets today then 1 a day for 4 days 6 tablet 1  . benzonatate (TESSALON) 100 MG capsule Take 1 capsule (100 mg total) 2 (two) times daily as needed by mouth for cough. 20 capsule 0  . Cholecalciferol (VITAMIN D3) 2000 units capsule Take 1 capsule by mouth daily.    . clopidogrel (PLAVIX) 75 MG tablet Take 1 tablet (75 mg total) by mouth daily. NO MORE REFILLS W/O APPT 30 tablet 0  . esomeprazole (NEXIUM) 20 MG capsule Take 1 capsule by mouth 2 (two) times daily. otc    . losartan (COZAAR) 50 MG tablet Take 1 tablet (50 mg total) by mouth daily. NO MORE REFILLS W/O APPT 30 tablet 0  . metoprolol succinate (TOPROL XL) 25 MG 24 hr tablet Take 1 tablet (25 mg total) by mouth daily. NO MORE  REFILLS W/O APPT 30 tablet 0   No facility-administered medications prior to visit.     Review of Systems  Constitutional: Negative for chills, fever, malaise/fatigue and weight loss.  HENT: Negative for ear discharge, ear pain and sore throat.   Eyes: Negative for blurred vision.  Respiratory: Negative for cough, sputum production, shortness of breath and wheezing.   Cardiovascular: Negative for chest pain, palpitations and leg swelling.  Gastrointestinal: Negative for abdominal pain, blood in stool, constipation, diarrhea, heartburn, melena and nausea.  Genitourinary: Negative for dysuria, frequency, hematuria and urgency.  Musculoskeletal: Negative for back pain, joint pain, myalgias and neck pain.  Skin: Negative for rash.  Neurological: Negative for dizziness, tingling, sensory change, focal weakness and headaches.  Endo/Heme/Allergies: Negative for environmental  allergies and polydipsia. Does not bruise/bleed easily.  Psychiatric/Behavioral: Negative for depression and suicidal ideas. The patient is not nervous/anxious and does not have insomnia.      Objective  Vitals:   10/28/17 1107  BP: 130/78  Pulse: 80  Weight: 170 lb (77.1 kg)  Height: 5\' 6"  (1.676 m)    Physical Exam  Constitutional: She is well-developed, well-nourished, and in no distress. No distress.  HENT:  Head: Normocephalic and atraumatic.  Right Ear: External ear normal.  Left Ear: External ear normal.  Nose: Nose normal.  Mouth/Throat: Oropharynx is clear and moist.  Eyes: Conjunctivae and EOM are normal. Pupils are equal, round, and reactive to light. Right eye exhibits no discharge. Left eye exhibits no discharge.  Neck: Normal range of motion. Neck supple. No JVD present. No thyromegaly present.  Cardiovascular: Normal rate, regular rhythm, normal heart sounds and intact distal pulses. Exam reveals no gallop and no friction rub.  No murmur heard. Pulmonary/Chest: Effort normal and breath sounds normal. She has no wheezes. She has no rales.  Abdominal: Soft. Bowel sounds are normal. She exhibits no mass. There is no tenderness. There is no guarding.  Musculoskeletal: Normal range of motion. She exhibits no edema.  Lymphadenopathy:    She has no cervical adenopathy.  Neurological: She is alert.  Skin: Skin is warm and dry. Lesion noted. She is not diaphoretic.     Psychiatric: Mood and affect normal.  Nursing note and vitals reviewed.     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 hemagioma   Relevant Orders   Ambulatory referral to Dermatology      No orders  of the defined types were placed in this encounter.     Dr. [facility]  10/28/17

## 2017-11-05 ENCOUNTER — Other Ambulatory Visit: Payer: Self-pay

## 2017-11-05 ENCOUNTER — Inpatient Hospital Stay: Payer: Medicare Other

## 2017-11-05 ENCOUNTER — Encounter: Payer: Self-pay | Admitting: Hematology and Oncology

## 2017-11-05 ENCOUNTER — Inpatient Hospital Stay: Payer: Medicare Other | Attending: Hematology and Oncology | Admitting: Hematology and Oncology

## 2017-11-05 VITALS — BP 143/84 | HR 66 | Temp 97.5°F | Resp 20 | Ht 66.0 in | Wt 171.3 lb

## 2017-11-05 DIAGNOSIS — E785 Hyperlipidemia, unspecified: Secondary | ICD-10-CM | POA: Insufficient documentation

## 2017-11-05 DIAGNOSIS — Z86718 Personal history of other venous thrombosis and embolism: Secondary | ICD-10-CM

## 2017-11-05 DIAGNOSIS — Z7902 Long term (current) use of antithrombotics/antiplatelets: Secondary | ICD-10-CM | POA: Diagnosis not present

## 2017-11-05 DIAGNOSIS — I10 Essential (primary) hypertension: Secondary | ICD-10-CM | POA: Diagnosis not present

## 2017-11-05 DIAGNOSIS — Z7982 Long term (current) use of aspirin: Secondary | ICD-10-CM | POA: Diagnosis not present

## 2017-11-05 DIAGNOSIS — Z79899 Other long term (current) drug therapy: Secondary | ICD-10-CM | POA: Diagnosis not present

## 2017-11-05 DIAGNOSIS — Z7901 Long term (current) use of anticoagulants: Secondary | ICD-10-CM | POA: Insufficient documentation

## 2017-11-05 DIAGNOSIS — I824Z2 Acute embolism and thrombosis of unspecified deep veins of left distal lower extremity: Secondary | ICD-10-CM | POA: Diagnosis not present

## 2017-11-05 DIAGNOSIS — R791 Abnormal coagulation profile: Secondary | ICD-10-CM | POA: Diagnosis not present

## 2017-11-05 DIAGNOSIS — Z86711 Personal history of pulmonary embolism: Secondary | ICD-10-CM | POA: Diagnosis not present

## 2017-11-05 DIAGNOSIS — I82812 Embolism and thrombosis of superficial veins of left lower extremities: Secondary | ICD-10-CM | POA: Insufficient documentation

## 2017-11-05 LAB — ANTITHROMBIN III: AntiThromb III Func: 89 % (ref 75–120)

## 2017-11-05 NOTE — Progress Notes (Signed)
Patient here today as new evaluation regarding thrombophlebitis of left lower extremity.  Referred by Otilio Miu. Patient states she had labs drawn @ [facility] to determine if her DVT's are hereditary, but to date has not seen results.

## 2017-11-05 NOTE — Progress Notes (Signed)
[facility] day:  11/05/2017  Chief Complaint: Angela Sparks is a 66 y.o. female with a history of DVT and PE who is referred in consultation by Dr. Otilio Sparks for assessment and management.  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 Patient was admitted to the hospital for 6 days for anticoagulation purposes. She notes that she refused Lovenox injections, which prolonged her admission. Patient was on warfarin for "years" before transitioning to Clopidogrel.  Patient was followed by Dr. Rollene Sparks in [place] prior to his retirement. She is now followed by Dr. Ubaldo Sparks.   She presented to the [facility] emergency room on 10/18/2017 with a several day history of left calf pain, swelling and redness.  She described shortness of breath.  She just computed completed a Z-Pak 5 days prior for sinusitis.  Examination revealed the left calf 2 cm circumferentially larger than the right.  Chest CT angiogram on 10/19/2017 revealed no evidence of pulmonary embolism. She received Lovenox x 1 and instructed to follow-up in the ER the following morning for a lower extremity ultrasound.  She was unable to have venous doppler due to the [facility] not having the equipment. She was observed several hours and discharged home. Bilateral lower extremity duplex on 10/20/2017 revealed no evidence of DVT in either extremity. In the left leg, there was no evidence of obstruction proximal to the inguinal ligament and the common femoral vein.  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  There were abnormalities consistent with sequela of a prior venous obstructive process, with findings that appear chronic/long-standing in nature toward identified in the proximal  veins.  Labs included a hematocrit 39.3, hemoglobin 13, MCV 91.9, platelets 204,000, white count 7700 with an [place] of 5500.  Creatinine was 1.2.  D-dimer was 402 (less than 230). PT was 11.2 with an INR of 0.98. PTT was 40 (27.7-37.7).  She indicates that her sister has protein S deficiency and is on Xarelto.  Her sister developed DVT x 3 in her lifetime.  Her first clot occurred at the age of 8 or 37, and was precipitated by prolonged travel on a bus.   She feels that her clot has "moved around". She has continued swelling in the LEFT lower extremity. She is wearing anti-embolism stockings regularly. Patient denies any bruising, bleeding, fevers, or sweats. She continues to have remnants of a  non-productive cough that treated with a course of Azithromycin back in November.   She denies premature pregnancy loss. She denies any family history of CVA or MI at an early age.   Last mammogram was on 04/22/2017. Colonoscopy was "several years ago". She notes that she advised her gastroenterologist that it would be the last one because of the bad experience with the pre-procedural prep.    Past Medical History:  Diagnosis Date  . DVT (deep venous thrombosis) ([place])   . Hyperlipidemia   . Hypertension   . Multiple thyroid nodules   . PE (pulmonary embolism)     Past Surgical History:  Procedure Laterality Date  . BREAST BIOPSY Right 10/28/12   [place]  bx-neg  . BREAST BIOPSY Right 10/15/13   [place] bx/clip-neg  . COLONOSCOPY  2007  . DOPPLER ECHOCARDIOGRAPHY  07/07/2009  . NM MYOVIEW LTD  07/07/2009  .  TRANSTHORACIC ECHOCARDIOGRAM  07/07/2009    Family History  Problem Relation Age of Onset  . Breast cancer Neg Hx     Social History:  reports that  has never smoked. she has never used smokeless tobacco. She reports that she does not drink alcohol or use drugs. Patient is a retired Photographer. She lives in [place].  The patient is accompanied by her husband, [name],  today.  Allergies:  Allergies  Allergen Reactions  . Codeine Other (See Comments)    Current Medications: Current Outpatient Medications  Medication Sig Dispense Refill  . aspirin EC 81 MG tablet Take 1 tablet by mouth daily.    . clopidogrel (PLAVIX) 75 MG tablet Take 1 tablet (75 mg total) by mouth daily. NO MORE REFILLS W/O APPT 30 tablet 0  . esomeprazole (NEXIUM) 20 MG capsule Take 1 capsule by mouth 2 (two) times daily. otc    . losartan (COZAAR) 50 MG tablet Take 1 tablet (50 mg total) by mouth daily. NO MORE REFILLS W/O APPT 30 tablet 0  . metoprolol succinate (TOPROL XL) 25 MG 24 hr tablet Take 1 tablet (25 mg total) by mouth daily. NO MORE REFILLS W/O APPT 30 tablet 0   No current facility-administered medications for this visit.     Review of Systems:  GENERAL:  Feels good.  No fevers, sweats or weight loss. PERFORMANCE STATUS (ECOG):  1 HEENT:  No visual changes, runny nose, sore throat, mouth sores or tenderness. Lungs: No shortness of breath.  Cough x 3 weeks.  No hemoptysis. Cardiac:  No chest pain, palpitations, orthopnea, or PND. GI:  No nausea, vomiting, diarrhea, constipation, melena or hematochezia.  Last colonoscopy "several years ago". GU:  No urgency, frequency, dysuria, or hematuria. Musculoskeletal:  No back pain.  Arthritis in hands.  No muscle tenderness. Extremities:  Pain or swelling in left leg, improved. Skin:  No rashes or skin changes. Neuro:  No headache, numbness or weakness, balance or coordination issues. Endocrine:  H/o thyroid nodules.  No diabetes, hot flashes or night sweats. Psych:  No mood changes, depression or anxiety. Pain:  No focal pain. Review of systems:  All other systems reviewed and found to be negative.  Physical Exam: Blood pressure (!) 143/84, pulse 66, temperature (!) 97.5 F (36.4 C), resp. rate 20, height 5\' 6"  (1.676 m), weight 171 lb 4.8 oz (77.7 kg). GENERAL:  Well developed, well nourished, woman sitting  comfortably in the exam room in no acute distress. MENTAL STATUS:  Alert and oriented to person, place and time. HEAD:  Shoulder length gray/silver hair.  Normocephalic, atraumatic, face symmetric, no Cushingoid features. EYES:  Glasses.  Blue eyes.  Pupils equal round and reactive to light and accomodation.  No conjunctivitis or scleral icterus. ENT:  Oropharynx clear without lesion.  Tongue normal. Mucous membranes moist.  RESPIRATORY:  Clear to auscultation without rales, wheezes or rhonchi. CARDIOVASCULAR:  Regular rate and rhythm without murmur, rub or gallop. ABDOMEN:  Soft, non-tender, with active bowel sounds, and no hepatosplenomegaly.  No masses. SKIN:  No rashes, ulcers or lesions. EXTREMITIES: Mild left leg edema with palpable varicosities extending posteriorly on her calf. No edema, no skin discoloration or tenderness. LYMPH NODES: No palpable cervical, supraclavicular, axillary or inguinal adenopathy  NEUROLOGICAL: Unremarkable. PSYCH:  Appropriate.   No visits with results within 3 Day(s) from this visit.  Latest known visit with results is:  Office Visit on 02/02/2016  Component Date Value Ref Range Status  . Influenza  A, POC 02/02/2016 Negative  Negative Final  . Influenza B, POC 02/02/2016 Negative  Negative Final    Assessment:  Leyli L Sangiovanni is a 66 y.o. female with a recent history of left lower extremity superficial thrombosis.  She has a history of left lower extremity DVT and bilateral pulmonary embolism in 2003.  DVT and PE were precipitated by hormone replacement therapy.  She was treated with Coumadin x several years then switched to Plavix in 2006 or 2007.  Bilateral lower extremity duplex on 10/19/2017 no evidence of DVT.  There was acute left superficial thrombosis of the varicosities or other superficial veins.  There were abnormalities consistent with sequela of a prior venous obstructive process, with findings that appear chronic/long-standing in nature  toward identified in the proximal veins.  Labs in 09/2017 revealed a normal CBC with diff.  PTT was slightly elevated.  She has a family history of thrombosis.  Her sister has protein S deficiency.  Symptomatically, she has ongoing mild pain and swelling in the left lower extremity.  Exam reveals palpable varicosities.  Plan: 1.  Discuss prior history of DVT and PE precipitated by hormone replacement therapy.  Discuss family history of possible protein S deficiency.  Patient denies any systemic symptoms.  She is up to date on health maintenance issues.  Discuss hypercoagulable work-up. 2.  Discuss patient's current use of Plavix, a platelet drug.  This is not a treatmet for DVT/PE.  Discuss plan for lifelong anticoagulation if recurrent DVT documented.  Discuss patient's concern about Xarelto, 3.  Labs today: Factor IV Leiden, prothrombin gene mutation, anticardiolipin antibodies, beta 2 glycoprotein antibody, lupus anticoagulant panel, ATIII, protein S total/activity, protein C total/activity. 4.  If patient found to have a lupus anticoagulant, we discussed the need for retesting in 3 months to confirm diagnosis.  If repeat testing is positive, she would have the lupus anticoagulant syndrome and would require lifelong anticoagulation given her history of DVT and PE.   5.  Given her recent history of thrombosis, she will require repeat testing if protein C or S if slightly low given consumption of protein C and S with acute clot.  6.  Discuss potential use of anticoagulants in the future if recurrent DVT documented.  Patient has concerns citing that she has "heard too much bad stuff from other people" about Xarelto. She has refused this medication in the past when offered at [facility] and by her PCP. Patient provided written information on this medication today for further review at home. We will plan to discuss further at her next visit.  7.  RTC on 11/26/2017 for MD assessment and to review  hypercoagulable workup.    Honor Loh, NP  11/05/2017, 9:57 AM   I saw and evaluated the patient, participating in the key portions of the service and reviewing pertinent diagnostic studies and records.  I reviewed the nurse practitioner's note and agree with the findings and the plan.  Multiple questions were asked by the patient and answered.   Nolon Stalls, MD 11/05/2017,3:13 PM

## 2017-11-05 NOTE — Patient Instructions (Signed)
Apixaban oral tablets °What is this medicine? °APIXABAN (a PIX a ban) is an anticoagulant (blood thinner). It is used to lower the chance of stroke in people with a medical condition called atrial fibrillation. It is also used to treat or prevent blood clots in the lungs or in the veins. °This medicine may be used for other purposes; ask your health care provider or pharmacist if you have questions. °COMMON BRAND NAME(S): Eliquis °What should I tell my health care provider before I take this medicine? °They need to know if you have any of these conditions: °-bleeding disorders °-bleeding in the brain °-blood in your stools (black or tarry stools) or if you have blood in your vomit °-history of stomach bleeding °-kidney disease °-liver disease °-mechanical heart valve °-an unusual or allergic reaction to apixaban, other medicines, foods, dyes, or preservatives °-pregnant or trying to get pregnant °-breast-feeding °How should I use this medicine? °Take this medicine by mouth with a glass of water. Follow the directions on the prescription label. You can take it with or without food. If it upsets your stomach, take it with food. Take your medicine at regular intervals. Do not take it more often than directed. Do not stop taking except on your doctor's advice. Stopping this medicine may increase your risk of a blot clot. Be sure to refill your prescription before you run out of medicine. °Talk to your pediatrician regarding the use of this medicine in children. Special care may be needed. °Overdosage: If you think you have taken too much of this medicine contact a poison control center or emergency room at once. °NOTE: This medicine is only for you. Do not share this medicine with others. °What if I miss a dose? °If you miss a dose, take it as soon as you can. If it is almost time for your next dose, take only that dose. Do not take double or extra doses. °What may interact with this medicine? °This medicine may  interact with the following: °-aspirin and aspirin-like medicines °-certain medicines for fungal infections like ketoconazole and itraconazole °-certain medicines for seizures like carbamazepine and phenytoin °-certain medicines that treat or prevent blood clots like warfarin, enoxaparin, and dalteparin °-clarithromycin °-NSAIDs, medicines for pain and inflammation, like ibuprofen or naproxen °-rifampin °-ritonavir °-St. John's wort °This list may not describe all possible interactions. Give your health care provider a list of all the medicines, herbs, non-prescription drugs, or dietary supplements you use. Also tell them if you smoke, drink alcohol, or use illegal drugs. Some items may interact with your medicine. °What should I watch for while using this medicine? °Visit your doctor or health care professional for regular checks on your progress. °Notify your doctor or health care professional and seek emergency treatment if you develop breathing problems; changes in vision; chest pain; severe, sudden headache; pain, swelling, warmth in the leg; trouble speaking; sudden numbness or weakness of the face, arm or leg. These can be signs that your condition has gotten worse. °If you are going to have surgery or other procedure, tell your doctor that you are taking this medicine. °What side effects may I notice from receiving this medicine? °Side effects that you should report to your doctor or health care professional as soon as possible: °-allergic reactions like skin rash, itching or hives, swelling of the face, lips, or tongue °-signs and symptoms of bleeding such as bloody or black, tarry stools; red or dark-brown urine; spitting up blood or brown material that looks like coffee   grounds; red spots on the skin; unusual bruising or bleeding from the eye, gums, or nose °This list may not describe all possible side effects. Call your doctor for medical advice about side effects. You may report side effects to FDA at  1-800-FDA-1088. °Where should I keep my medicine? °Keep out of the reach of children. °Store at room temperature between 20 and 25 degrees C (68 and 77 degrees F). Throw away any unused medicine after the expiration date. °NOTE: This sheet is a summary. It may not cover all possible information. If you have questions about this medicine, talk to your doctor, pharmacist, or health care provider. °© 2018 [employer] (2016-06-03 11:54:23) °  ° °

## 2017-11-06 LAB — PROTEIN C, TOTAL: PROTEIN C, TOTAL: 113 % (ref 60–150)

## 2017-11-06 LAB — LUPUS ANTICOAGULANT PANEL
DRVVT: 40.5 s (ref 0.0–47.0)
PTT LA: 31.2 s (ref 0.0–51.9)

## 2017-11-06 LAB — PROTEIN S ACTIVITY: Protein S Activity: 57 % — ABNORMAL LOW (ref 63–140)

## 2017-11-06 LAB — PROTEIN S, TOTAL: Protein S Ag, Total: 86 % (ref 60–150)

## 2017-11-06 LAB — PROTEIN C ACTIVITY: Protein C Activity: 155 % (ref 73–180)

## 2017-11-07 LAB — CARDIOLIPIN ANTIBODIES, IGG, IGM, IGA: ANTICARDIOLIPIN IGM: 13 [MPL'U]/mL — AB (ref 0–12)

## 2017-11-08 LAB — BETA-2-GLYCOPROTEIN I ABS, IGG/M/A
Beta-2-Glycoprotein I IgA: <9 GPI IgA units (ref 0–25)
Beta-2-Glycoprotein I IgM: <9 GPI IgM units (ref 0–32)

## 2017-11-10 LAB — FACTOR 5 LEIDEN

## 2017-11-11 LAB — PROTHROMBIN GENE MUTATION

## 2017-11-26 ENCOUNTER — Inpatient Hospital Stay: Payer: Medicare Other | Attending: Hematology and Oncology | Admitting: Hematology and Oncology

## 2017-11-26 VITALS — BP 121/79 | HR 71 | Temp 97.5°F | Resp 18 | Wt 171.5 lb

## 2017-11-26 DIAGNOSIS — R760 Raised antibody titer: Secondary | ICD-10-CM | POA: Insufficient documentation

## 2017-11-26 DIAGNOSIS — I82812 Embolism and thrombosis of superficial veins of left lower extremities: Secondary | ICD-10-CM

## 2017-11-26 DIAGNOSIS — Z86718 Personal history of other venous thrombosis and embolism: Secondary | ICD-10-CM | POA: Diagnosis not present

## 2017-11-26 DIAGNOSIS — I10 Essential (primary) hypertension: Secondary | ICD-10-CM | POA: Insufficient documentation

## 2017-11-26 DIAGNOSIS — Z7902 Long term (current) use of antithrombotics/antiplatelets: Secondary | ICD-10-CM | POA: Insufficient documentation

## 2017-11-26 DIAGNOSIS — Z79899 Other long term (current) drug therapy: Secondary | ICD-10-CM | POA: Diagnosis not present

## 2017-11-26 DIAGNOSIS — E785 Hyperlipidemia, unspecified: Secondary | ICD-10-CM | POA: Insufficient documentation

## 2017-11-26 DIAGNOSIS — Z7982 Long term (current) use of aspirin: Secondary | ICD-10-CM | POA: Diagnosis not present

## 2017-11-26 DIAGNOSIS — Z86711 Personal history of pulmonary embolism: Secondary | ICD-10-CM | POA: Insufficient documentation

## 2017-11-26 NOTE — Progress Notes (Signed)
[facility] day:  11/26/2017  Chief Complaint: Angela Sparks is a 67 y.o. female with a history of DVT and PE and a recent history of superficial thrombosis who is seen for review of hypercoagulable work-up and discussion regarding direction of therapy.  HPI:  The patient was last seen in the hematology clinic on 11/05/2017 for initial consultation.  At that time, she had recently been diagnosis with a superficial clot.  She was on Plavix.  She underwent a hypercoagulable work-up.  Factor V Leiden and prothrombin gene mutation were negative.  Protein C total was 113%, protein C activity 155%, protein S antigen 86%, protein S activity 57% (63% - 140%), ATIII activity 89%.  Lupus anticoagulant panel was negative.  Beta2-glycoprotein antibodies were normal.  Anticardiolipin antibodies revealed an IgM of 13 (indeterminate).  During the interim, she has done well.  She describes feeling like the blood clot moved a little.  She described this sensation like a "rubber band snapping".   Past Medical History:  Diagnosis Date  . DVT (deep venous thrombosis) ([place])   . Hyperlipidemia   . Hypertension   . Multiple thyroid nodules   . PE (pulmonary embolism)     Past Surgical History:  Procedure Laterality Date  . BREAST BIOPSY Right 10/28/12   [place]  bx-neg  . BREAST BIOPSY Right 10/15/13   [place] bx/clip-neg  . COLONOSCOPY  2007  . DOPPLER ECHOCARDIOGRAPHY  07/07/2009  . NM MYOVIEW LTD  07/07/2009  . TRANSTHORACIC ECHOCARDIOGRAM  07/07/2009    Family History  Problem Relation Age of Onset  . Diabetes Mother   . Thyroid disease Mother   . Stroke Mother   . CAD Father   . Breast cancer Neg Hx     Social History:  reports that  has never smoked. she has never used smokeless tobacco. She reports that she does not drink alcohol or use drugs. Patient is a retired Photographer. She lives in [place].  The patient is accompanied by her  husband, Angela Sparks, today.  Allergies:  Allergies  Allergen Reactions  . Codeine Other (See Comments)    Current Medications: Current Outpatient Medications  Medication Sig Dispense Refill  . aspirin EC 81 MG tablet Take 1 tablet by mouth daily.    . clopidogrel (PLAVIX) 75 MG tablet Take 1 tablet (75 mg total) by mouth daily. NO MORE REFILLS W/O APPT 30 tablet 0  . esomeprazole (NEXIUM) 20 MG capsule Take 1 capsule by mouth 2 (two) times daily. otc    . losartan (COZAAR) 50 MG tablet Take 1 tablet (50 mg total) by mouth daily. NO MORE REFILLS W/O APPT 30 tablet 0  . metoprolol succinate (TOPROL XL) 25 MG 24 hr tablet Take 1 tablet (25 mg total) by mouth daily. NO MORE REFILLS W/O APPT 30 tablet 0   No current facility-administered medications for this visit.     Review of Systems:  GENERAL:  Feels good.  No fevers, sweats or weight loss. PERFORMANCE STATUS (ECOG):  1 HEENT:  No visual changes, runny nose, sore throat, mouth sores or tenderness. Lungs: No shortness of breath.  Cough x 3 weeks.  No hemoptysis. Cardiac:  No chest pain, palpitations, orthopnea, or PND. GI:  No nausea, vomiting, diarrhea, constipation, melena or hematochezia.  Last colonoscopy "several years ago". GU:  No urgency, frequency, dysuria, or hematuria. Musculoskeletal:  No back pain.  Arthritis in hands.  No muscle tenderness. Extremities:  Pain  or swelling in left leg, improved. Skin:  No rashes or skin changes. Neuro:  No headache, numbness or weakness, balance or coordination issues. Endocrine:  H/o thyroid nodules.  No diabetes, hot flashes or night sweats. Psych:  No mood changes, depression or anxiety. Pain:  No focal pain. Review of systems:  All other systems reviewed and found to be negative.  Physical Exam: Blood pressure 121/79, pulse 71, temperature (!) 97.5 F (36.4 C), temperature source Tympanic, resp. rate 18, weight 171 lb 8.3 oz (77.8 kg). GENERAL:  Well developed, well nourished, woman  sitting comfortably in the exam room in no acute distress. MENTAL STATUS:  Alert and oriented to person, place and time. HEAD:  Shoulder length gray/silver hair.  Normocephalic, atraumatic, face symmetric, no Cushingoid features. EYES:  Glasses.  Blue eyes.  No conjunctivitis or scleral icterus. EXTREMITIES: Mild left leg edema withvaricosities extending posteriorly on her calf.  NEUROLOGICAL: Unremarkable. PSYCH:  Appropriate.   No visits with results within 3 Day(s) from this visit.  Latest known visit with results is:  Appointment on 11/05/2017  Component Date Value Ref Range Status  . Protein C, Total 11/05/2017 113  60 - 150 % Final   Comment: (NOTE) Performed At: [facility] [place], [place] [id number] Rush Farmer MD AT:[id number]   . Protein C Activity 11/05/2017 155  73 - 180 % Final   Comment: (NOTE) Performed At: [facility] [place], [place] [id number] Rush Farmer MD [id number]   . Protein S Activity 11/05/2017 57* 63 - 140 % Final   Comment: (NOTE) A deficiency of protein S (PS), either congenital or acquired, increases the risk of thromboembolism. PS activity levels may be falsely low in individuals with APCR/Factor V Leiden. Consider performing free protein S antigen in those with APCR/Factor V Leiden before making a diagnosis of protein S deficiency. Acquired PS deficiency is more common than congenital deficiency. PS values decrease with normal pregnancy, and are also dependent on age, sex and hormone status. PS values tend to be lower in a younger age group and lower in women than in men. Levels may be decreased in pre-menopausal women on oral contraceptive agents. Acquired deficiency can occur as a result of vitamin K deficiency or antagonism, severe hepatic disorders, (hepatitis, cirrhosis, etc.), nephrotic syndrome, inflammatory bowel disease, certain chemotherapeutic agents, L-asparaginse therapy,  sepsis, disseminated intravascular coagulation (DIC) and acute thrombosis. Levels may be decreased in                           patients with polycythemia vera, sickle cell disease and essential thrombocythemia. Repeat evaluation on a new plasma sample to confirm or refute this result should be considered, after ruling out acquired causes, depending on the clinical scenario. Performed At: [facility] [place], [place] [id number] Rush Farmer MD NI:[id number]   . Protein S Ag, Total 11/05/2017 86  60 - 150 % Final   Comment: (NOTE) This test was developed and its performance characteristics determined by [facility]. It has not been cleared or approved by the Food and Drug Administration. Performed At: [facility] [place], [place] [id number] Rush Farmer MD IR:[id number]   . AntiThromb III Func 11/05/2017 89  75 - 120 % Final   Performed at [facility] [address], [place], [place] [address]  . PTT Lupus Anticoagulant 11/05/2017 31.2  0.0 - 51.9 sec Final  . DRVVT 11/05/2017  40.5  0.0 - 47.0 sec Final  . Lupus Anticoag Interp 11/05/2017 Comment:   Corrected   Comment: (NOTE) No lupus anticoagulant was detected. Performed At: [facility] [place], [place] [id number] Rush Farmer MD [id number]   . Beta-2 Glyco I IgG 11/05/2017 <9  0 - 20 GPI IgG units Final   Comment: (NOTE) The reference interval reflects a 3SD or 99th percentile interval, which is thought to represent a potentially clinically significant result in accordance with the International Consensus Statement on the classification criteria for definitive antiphospholipid syndrome (APS). J Thromb Haem 2006;4:295-306.   . Beta-2-Glycoprotein I IgM 11/05/2017 <9  0 - 32 GPI IgM units Final   Comment: (NOTE) The reference interval reflects a 3SD or 99th percentile interval, which is thought to represent a potentially clinically  significant result in accordance with the International Consensus Statement on the classification criteria for definitive antiphospholipid syndrome (APS). J Thromb Haem 2006;4:295-306. Performed At: [facility] [place], [place] [id number] Rush Farmer MD [id number]   . Beta-2-Glycoprotein I IgA 11/05/2017 <9  0 - 25 GPI IgA units Final   Comment: (NOTE) The reference interval reflects a 3SD or 99th percentile interval, which is thought to represent a potentially clinically significant result in accordance with the International Consensus Statement on the classification criteria for definitive antiphospholipid syndrome (APS). J Thromb Haem 2006;4:295-306.   Marland Kitchen Anticardiolipin IgG 11/05/2017 <9  0 - 14 GPL U/mL Final   Comment: (NOTE)                          Negative:              <15                          Indeterminate:     15 - 20                          Low-Med Positive: >20 - 80                          High Positive:         >80   . Anticardiolipin IgM 11/05/2017 13* 0 - 12 MPL U/mL Final   Comment: (NOTE)                          Negative:              <13                          Indeterminate:     13 - 20                          Low-Med Positive: >20 - 80                          High Positive:         >80   . Anticardiolipin IgA 11/05/2017 <9  0 - 11 APL U/mL Final   Comment: (NOTE)                          Negative:              <  12                          Indeterminate:     12 - 20                          Low-Med Positive: >20 - 80                          High Positive:         >80 Performed At: [facility] [place], [place] [id number] Rush Farmer MD FX:[id number]   . Recommendations-PTGENE: 11/05/2017 Comment   Final   Comment: (NOTE) NEGATIVE No mutation identified. Comment: A point mutation (G20210A) in the factor II (prothrombin) gene is the second most common cause of inherited thrombophilia.  The incidence of this mutation in the [place] Caucasian population is about 2% and in the [place] American population it is approximately 0.5%. This mutation is rare in the [place] and Native American population. Being heterozygous for a prothrombin mutation increases the risk for developing venous thrombosis about 2 to 3 times above the general population risk. Being homozygous for the prothrombin gene mutation increases the relative risk for venous thrombosis further, although it is not yet known how much further the risk is increased. In women heterozygous for the prothrombin gene mutation, the use of estrogen containing oral contraceptives increases the relative risk of venous thrombosis about 16 times and the risk of developing cerebral thrombosis is also significantly increased. In pregnancy the pr                          othrombin gene mutation increases risk for venous thrombosis and may increase risk for stillbirth, placental abruption, pre-eclampsia and fetal growth restriction. If the patient possesses two or more congenital or acquired thrombophilic risk factors, the risk for thrombosis may rise to more than the sum of the risk ratios for the individual mutations. This assay detects only the prothrombin G20210A mutation and does not measure genetic abnormalities elsewhere in the genome. Other thrombotic risk factors may be pursued through systematic clinical laboratory analysis. These factors include the R506Q (Leiden) mutation in the Factor V gene, plasma homocysteine levels, as well as testing for deficiencies of antithrombin III, protein C and protein S. Genetic Counselors are available for health care providers to discuss results at [phone] [phone]). Methodology: DNA analysis of the Factor II gene was performed by PCR amplification followed by restriction analysis. The di                          agnostic sensitivity is >99% for both. All the tests must be combined  with clinical information for the most accurate interpretation. Molecular-based testing is highly accurate, but as in any laboratory test, diagnostic errors may occur. This test was developed and its performance characteristics determined by [facility]. It has not been cleared or approved by the Food and Drug Administration. Poort SR, et al. Blood. 1996; 42:6834-1962. Varga EA. Circulation. 2004; 229:N98-X21. Mervin Hack, et [place]; 19:700-703. Allison Quarry, PhD, [facility] Ruben Reason, PhD, [facility] Annetta Maw, M.S., PhD, [facility] Alfredo Bach, PhD, [facility] Norva Riffle, PhD, [facility] Earlean Polka, PhD, [facility] Performed At: [facility] [address] [place], [place] [id number] Nechama Guard MD KG:[id number]   .  Recommendations-F5LEID: 11/05/2017 Comment   Final   Comment: (NOTE) Result:  Negative (no mutation found) Factor V Leiden is a specific mutation (R506Q) in the factor V gene that is associated with an increased risk of venous thrombosis. Factor V Leiden is more resistant to inactivation by activated protein C.  As a result, factor V persists in the circulation leading to a mild hyper- coagulable state.  The Leiden mutation accounts for 90% - 95% of APC resistance.  Factor V Leiden has been reported in patients with deep vein thrombosis, pulmonary embolus, central retinal vein occlusion, cerebral sinus thrombosis and hepatic vein thrombosis. Other risk factors to be considered in the workup for venous thrombosis include the G20210A mutation in the factor II (prothrombin) gene, protein S and C deficiency, and antithrombin deficiencies. Anticardiolipin antibody and lupus anticoagulant analysis may be appropriate for certain patients, as well as homocysteine levels. Contact your local [facility] for information on how to order additi                          onal testing if desired. **Genetic counselors are available for health  care providers to**  discuss results at [phone] [phone]). Methodology: DNA analysis of the Factor V gene was performed by allele-specific PCR. The diagnostic sensitivity and specificity is >99% for both. Molecular-based testing is highly accurate, but as in any laboratory test, diagnostic errors may occur. All test results must be combined with clinical information for the most accurate interpretation. This test was developed and its performance characteristics determined by [facility]. It has not been cleared or approved by the Food and Drug Administration. References: Voelkerding K (1996).  Clin Lab Med [phone]. Allison Quarry, PhD, [facility] Ruben Reason, PhD, [facility] Annetta Maw, M.S., PhD, [facility] Alfredo Bach, PhD, [facility] Norva Riffle, PhD, [facility] Earlean Polka PhD, [facility] Performed At: [facility] [address] [place], [place] [id number] Nechama Guard MD ZD:[id number]                          7     Assessment:  Illiana RANESHIA DERICK is a 67 y.o. female with a recent history of left lower extremity superficial thrombosis.  She has a history of left lower extremity DVT and bilateral pulmonary embolism in 2003.  DVT and PE were precipitated by hormone replacement therapy.  She was treated with Coumadin x several years then switched to Plavix in 2006 or 2007.  Bilateral lower extremity duplex on 10/19/2017 no evidence of DVT.  There was acute left superficial thrombosis of the varicosities or other superficial veins.  There were abnormalities consistent with sequela of a prior venous obstructive process, with findings that appear chronic/long-standing in nature toward identified in the proximal veins.  Labs in 09/2017 revealed a normal CBC with diff.  PTT was slightly elevated.  Hypercoagulable work-up on 11/05/2017 revealed a slightly low protein S activity of 57% (63% - 140%) and an indeterminant anticardiolipin antibody of 13%.   Normal studies included:  Factor V  Leiden, prothrombin gene mutation, lupus anticoagulant panel, beta2-glycoprotein antibodies, protein C total was 113%, protein C activity 155%, protein S antigen 86%, ATIII activity 89%.  Lupus anticoagulant panel was negative.    She has a family history of thrombosis.  Her sister has protein S deficiency.  Symptomatically, she has intermittent mild pain and swelling in the left lower extremity.  Exam reveals palpable varicosities.  Plan: 1.  Review hypercoagulable work-up.  Discuss unclear significance of slightly low protein S activity and indeterminant anticardiolipin antibody.  Discuss repeat testing. 2.  RTC on 01/28/2018 for labs (lupus anticoagulant panel, anticardiolipin antibodies, beta2 glycoprotein antibodies, protein S activity/antigen). 3.  RTC on 02/04/2018 for MD assessment and review of labs.   Lequita Asal, MD  11/26/2017, 3:35PM

## 2017-11-26 NOTE — Progress Notes (Signed)
Patient offers no complaints today. 

## 2017-12-01 ENCOUNTER — Ambulatory Visit
Admission: EM | Admit: 2017-12-01 | Discharge: 2017-12-01 | Disposition: A | Discharge status: Home / Self Care | Payer: Medicare Other | Attending: Family Medicine | Admitting: Family Medicine

## 2017-12-01 ENCOUNTER — Encounter: Payer: Self-pay | Admitting: *Deleted

## 2017-12-01 DIAGNOSIS — S61212A Laceration without foreign body of right middle finger without damage to nail, initial encounter: Secondary | ICD-10-CM | POA: Diagnosis not present

## 2017-12-01 DIAGNOSIS — Z23 Encounter for immunization: Secondary | ICD-10-CM

## 2017-12-01 DIAGNOSIS — W25XXXA Contact with sharp glass, initial encounter: Secondary | ICD-10-CM

## 2017-12-01 MED ORDER — TETANUS-DIPHTH-ACELL PERTUSSIS 5-2.5-18.5 LF-MCG/0.5 IM SUSP
0.5000 mL | Freq: Once | INTRAMUSCULAR | Status: AC
  Administered 2017-12-01: 0.5 mL via INTRAMUSCULAR

## 2017-12-01 MED ORDER — LIDOCAINE-EPINEPHRINE-TETRACAINE (LET) SOLUTION
3.0000 mL | Freq: Once | NASAL | Status: AC
  Administered 2017-12-01: 19:00:00 3 mL via TOPICAL

## 2017-12-01 NOTE — ED Provider Notes (Signed)
[facility]    CSN: [id number] Arrival date & time: 12/01/17  1800     History   Chief Complaint Chief Complaint  Patient presents with  . Laceration    HPI Angela Sparks is a 67 y.o. female.   The history is provided by the patient.  Laceration  Location:  Finger Finger laceration location:  R middle finger Length:  1.5cm Depth:  Cutaneous Quality: straight   Bleeding: venous and controlled with pressure   Time since incident:  2 hours Laceration mechanism:  Broken glass Foreign body present:  No foreign bodies Relieved by:  Pressure Tetanus status:  Out of date Associated symptoms: no fever, no focal weakness, no numbness, no rash, no redness, no swelling and no streaking     Past Medical History:  Diagnosis Date  . DVT (deep venous thrombosis) ([place])   . Hyperlipidemia   . Hypertension   . Multiple thyroid nodules   . PE (pulmonary embolism)     Patient Active Problem List   Diagnosis Date Noted  . Personal history of venous thrombosis and embolism 11/05/2017  . Superficial thrombosis of left lower extremity 11/05/2017  . Deep vein thrombosis (DVT) ([place]) 06/29/2014  . HLD (hyperlipidemia) 06/29/2014  . BP (high blood pressure) 06/29/2014    Past Surgical History:  Procedure Laterality Date  . BREAST BIOPSY Right 10/28/12   [place]  bx-neg  . BREAST BIOPSY Right 10/15/13   [place] bx/clip-neg  . COLONOSCOPY  2007  . DOPPLER ECHOCARDIOGRAPHY  07/07/2009  . NM MYOVIEW LTD  07/07/2009  . TRANSTHORACIC ECHOCARDIOGRAM  07/07/2009    OB History    No data available       Home Medications    Prior to Admission medications   Medication Sig Start Date End Date Taking? Authorizing Provider  aspirin EC 81 MG tablet Take 1 tablet by mouth daily.   Yes [provider]  clopidogrel (PLAVIX) 75 MG tablet Take 1 tablet (75 mg total) by mouth daily. NO MORE REFILLS W/O APPT 05/26/14  Yes Lorretta Harp, MD  esomeprazole (NEXIUM) 20 MG capsule Take  1 capsule by mouth 2 (two) times daily. otc   Yes [provider]  losartan (COZAAR) 50 MG tablet Take 1 tablet (50 mg total) by mouth daily. NO MORE REFILLS W/O APPT 05/25/14  Yes Lorretta Harp, MD  metoprolol succinate (TOPROL XL) 25 MG 24 hr tablet Take 1 tablet (25 mg total) by mouth daily. NO MORE REFILLS W/O APPT 05/25/14  Yes Lorretta Harp, MD    Family History Family History  Problem Relation Age of Onset  . Diabetes Mother   . Thyroid disease Mother   . Stroke Mother   . CAD Father   . Breast cancer Neg Hx     Social History Social History   Tobacco Use  . Smoking status: Never Smoker  . Smokeless tobacco: Never Used  Substance Use Topics  . Alcohol use: No    Alcohol/week: 0.0 oz  . Drug use: No     Allergies   Codeine   Review of Systems Review of Systems  Constitutional: Negative for fever.  Skin: Negative for rash.  Neurological: Negative for focal weakness.     Physical Exam Triage Vital Signs ED Triage Vitals  Enc Vitals Group     BP 12/01/17 1831 (!) 155/86     Pulse Rate 12/01/17 1831 71     Resp 12/01/17 1831 16  Temp 12/01/17 1831 98.2 F (36.8 C)     Temp Source 12/01/17 1831 Oral     SpO2 12/01/17 1831 98 %     Weight 12/01/17 1838 171 lb (77.6 kg)     Height 12/01/17 1838 5\' 6"  (1.676 m)     Head Circumference --      [place]ak Flow --      Pain Score --      Pain Loc --      Pain Edu? --      Excl. in [place]? --    No data found.  Updated Vital Signs BP (!) 155/86 (BP Location: Left Arm)   Pulse 71   Temp 98.2 F (36.8 C) (Oral)   Resp 16   Ht 5\' 6"  (1.676 m)   Wt 171 lb (77.6 kg)   SpO2 98%   BMI 27.60 kg/m   Visual Acuity Right Eye Distance:   Left Eye Distance:   Bilateral Distance:    Right Eye Near:   Left Eye Near:    Bilateral Near:     Physical Exam  Constitutional: She appears well-developed. No distress.  Musculoskeletal:       Right hand: She exhibits laceration (1.5cm to dorsal aspect of  right middle finger). She exhibits no bony tenderness, normal two-point discrimination, normal capillary refill, no deformity and no swelling. Normal sensation noted. Normal strength noted.  Skin: She is not diaphoretic.  Nursing note and vitals reviewed.    UC Treatments / Results  Labs (all labs ordered are listed, but only abnormal results are displayed) Labs Reviewed - No data to display  EKG  EKG Interpretation None       Radiology No results found.  Procedures Laceration Repair Date/Time: 12/01/2017 9:10 PM Performed by: Norval Gable, MD Authorized by: Norval Gable, MD   Consent:    Consent obtained:  Verbal   Consent given by:  Patient   Risks discussed:  Infection, pain, need for additional repair, poor cosmetic result, poor wound healing, retained foreign body, tendon damage, vascular damage and nerve damage   Alternatives discussed:  No treatment Anesthesia (see MAR for exact dosages):    Anesthesia method:  None Laceration details:    Location:  Finger   Finger location:  R long finger   Length (cm):  1.5 Repair type:    Repair type:  Simple Pre-procedure details:    Preparation:  Patient was prepped and draped in usual sterile fashion Exploration:    Hemostasis achieved with:  Direct pressure and LET   Wound exploration: wound explored through full range of motion and entire depth of wound probed and visualized     Wound extent: no foreign bodies/material noted, no muscle damage noted, no nerve damage noted, no tendon damage noted, no underlying fracture noted and no vascular damage noted     Contaminated: no   Treatment:    Area cleansed with:  Hibiclens   Amount of cleaning:  Standard   Irrigation solution:  Sterile water   Foreign body removal: no foreign bodies.   Skin repair:    Repair method:  Tissue adhesive Approximation:    Approximation:  Close Post-procedure details:    Dressing:  Non-adherent dressing   Patient tolerance of  procedure:  Tolerated well, no immediate complications   (including critical care time)  Medications Ordered in UC Medications  Tdap (BOOSTRIX) injection 0.5 mL (0.5 mLs Intramuscular Given 12/01/17 1854)  lidocaine-EPINEPHrine-tetracaine (LET) solution (3 mLs Topical Given 12/01/17 1853)  Initial Impression / Assessment and Plan / UC Course  I have reviewed the triage vital signs and the nursing notes.  Pertinent labs & imaging results that were available during my care of the patient were reviewed by me and considered in my medical decision making (see chart for details).      Final Clinical Impressions(s) / UC Diagnoses   Final diagnoses:  Laceration of right middle finger without foreign body without damage to nail, initial encounter    ED Discharge Orders    None     1. L diagnosis reviewed with patient 2. Procedure as above 3. Recommend supportive treatment with routine wound care (information given) 4. Follow-up prn if symptoms worsen or don't improve  Controlled Substance Prescriptions [place] Controlled Substance Registry consulted? Not Applicable   Norval Gable, MD 12/01/17 2112

## 2017-12-01 NOTE — ED Triage Notes (Signed)
1" laceration to right middle finger, pt unable to control bleeding, is on Plavix. Done on a broken glass jar.

## 2017-12-14 ENCOUNTER — Encounter: Payer: Self-pay | Admitting: Hematology and Oncology

## 2018-01-28 ENCOUNTER — Inpatient Hospital Stay: Payer: Medicare Other | Attending: Hematology and Oncology

## 2018-01-28 DIAGNOSIS — R760 Raised antibody titer: Secondary | ICD-10-CM | POA: Insufficient documentation

## 2018-01-28 DIAGNOSIS — Z7982 Long term (current) use of aspirin: Secondary | ICD-10-CM | POA: Insufficient documentation

## 2018-01-28 DIAGNOSIS — Z86711 Personal history of pulmonary embolism: Secondary | ICD-10-CM | POA: Insufficient documentation

## 2018-01-28 DIAGNOSIS — Z7902 Long term (current) use of antithrombotics/antiplatelets: Secondary | ICD-10-CM | POA: Diagnosis not present

## 2018-01-28 DIAGNOSIS — I82812 Embolism and thrombosis of superficial veins of left lower extremities: Secondary | ICD-10-CM

## 2018-01-28 DIAGNOSIS — I739 Peripheral vascular disease, unspecified: Secondary | ICD-10-CM | POA: Diagnosis not present

## 2018-01-28 DIAGNOSIS — E785 Hyperlipidemia, unspecified: Secondary | ICD-10-CM | POA: Diagnosis not present

## 2018-01-28 DIAGNOSIS — I10 Essential (primary) hypertension: Secondary | ICD-10-CM | POA: Insufficient documentation

## 2018-01-28 DIAGNOSIS — Z86718 Personal history of other venous thrombosis and embolism: Secondary | ICD-10-CM | POA: Diagnosis present

## 2018-01-28 DIAGNOSIS — Z79899 Other long term (current) drug therapy: Secondary | ICD-10-CM | POA: Insufficient documentation

## 2018-01-30 LAB — BETA-2-GLYCOPROTEIN I ABS, IGG/M/A
Beta-2 Glyco I IgG: <9 GPI IgG units (ref 0–20)
Beta-2-Glycoprotein I IgA: <9 GPI IgA units (ref 0–25)
Beta-2-Glycoprotein I IgM: <9 GPI IgM units (ref 0–32)

## 2018-01-30 LAB — PROTEIN S ACTIVITY: Protein S Activity: 55 % — ABNORMAL LOW (ref 63–140)

## 2018-01-30 LAB — LUPUS ANTICOAGULANT PANEL
DRVVT: 42.3 s (ref 0.0–47.0)
PTT Lupus Anticoagulant: 31.5 s (ref 0.0–51.9)

## 2018-01-30 LAB — CARDIOLIPIN ANTIBODIES, IGM+IGG
Anticardiolipin IgG: <9 GPL U/mL (ref 0–14)
Anticardiolipin IgM: <9 MPL U/mL (ref 0–12)

## 2018-01-30 LAB — PROTEIN S, TOTAL: Protein S Ag, Total: 83 % (ref 60–150)

## 2018-02-03 NOTE — Progress Notes (Addendum)
[facility] day:  02/04/2018  Chief Complaint: Angela Sparks is a 67 y.o. female with a history of DVT and PE and a recent history of superficial thrombosis who is seen for a 2 month assessment on Plavix.   HPI:  The patient was last seen in the hematology clinic on 11/26/2017. At that time, patient was doing well. She was was being treated for an acute superficial thrombosis of the varicosities in her LEFT lower extremity. Patient  described a "rubber band snapping" sensation in her leg.  Exam revealed mild pain and swelling to the left lower extremity. There were palpable varicosities noted. Patient continued on Plavix as previously prescribed.   Repeat hypercoagulable work-up labs done on 01/28/2018.  Prothrombin gene mutation remained negative.  Protein S antigen 83%, protein S activity 55% (63% - 140%). Lupus anticoagulant panel was negative.  Beta2-glycoprotein antibodies were normal.  Anticardiolipin antibodies now normal (IgM previously 13).   In the interim, patient has been doing well. She denies any acute symptoms today. Patient denies claudication pain in her LEFT lower extremity. She has experienced no increased swelling. Patient denies chest pain and shortness of breath. Mrs. Harnden notes a continued sensation of a "rubber band snapping" in her leg. She is adamant that, overall, she has improved.   Patient is eating well. Her weight has increased 4 pounds. Patient denies pain in the clinic today.    Past Medical History:  Diagnosis Date  . DVT (deep venous thrombosis) ([place])   . Hyperlipidemia   . Hypertension   . Multiple thyroid nodules   . PE (pulmonary embolism)     Past Surgical History:  Procedure Laterality Date  . BREAST BIOPSY Right 10/28/12   [place]  bx-neg  . BREAST BIOPSY Right 10/15/13   [place] bx/clip-neg  . COLONOSCOPY  2007  . DOPPLER ECHOCARDIOGRAPHY  07/07/2009  . NM MYOVIEW LTD  07/07/2009  . TRANSTHORACIC ECHOCARDIOGRAM   07/07/2009    Family History  Problem Relation Age of Onset  . Diabetes Mother   . Thyroid disease Mother   . Stroke Mother   . CAD Father   . Breast cancer Neg Hx     Social History:  reports that  has never smoked. she has never used smokeless tobacco. She reports that she does not drink alcohol or use drugs. Patient is a retired Photographer. She lives in [place].  The patient is accompanied by her husband, [name], today.  Allergies:  Allergies  Allergen Reactions  . Codeine Other (See Comments)    Current Medications: Current Outpatient Medications  Medication Sig Dispense Refill  . aspirin EC 81 MG tablet Take 1 tablet by mouth daily.    . clopidogrel (PLAVIX) 75 MG tablet Take 1 tablet (75 mg total) by mouth daily. NO MORE REFILLS W/O APPT 30 tablet 0  . esomeprazole (NEXIUM) 20 MG capsule Take 1 capsule by mouth 2 (two) times daily. otc    . losartan (COZAAR) 50 MG tablet Take 1 tablet (50 mg total) by mouth daily. NO MORE REFILLS W/O APPT 30 tablet 0  . metoprolol succinate (TOPROL XL) 25 MG 24 hr tablet Take 1 tablet (25 mg total) by mouth daily. NO MORE REFILLS W/O APPT 30 tablet 0   No current facility-administered medications for this visit.     Review of Systems:  GENERAL:  Feels good.  No fevers or sweats. Weight up 4 pounds.  PERFORMANCE STATUS (ECOG):  1 HEENT:  No visual changes, runny nose, sore throat, mouth sores or tenderness. Lungs: No shortness of breath, cough, or hemoptysis. Cardiac:  No chest pain, palpitations, orthopnea, or PND. GI:  No nausea, vomiting, diarrhea, constipation, melena or hematochezia.  Last colonoscopy "several years ago". GU:  No urgency, frequency, dysuria, or hematuria. Musculoskeletal:  No back pain.  Arthritis in hands.  No muscle tenderness. Extremities:  Sensation of "rubber band snapping". Pain and swelling in left leg, improved. Skin:  No rashes or skin changes. Neuro:  No headache, numbness or  weakness, balance or coordination issues. Endocrine:  H/o thyroid nodules.  No diabetes, hot flashes or night sweats. Psych:  No mood changes, depression or anxiety. Pain:  No focal pain. Review of systems:  All other systems reviewed and found to be negative.  Physical Exam: Blood pressure (!) 147/79, pulse 65, temperature (!) 97.5 F (36.4 C), temperature source Tympanic, resp. rate 18, weight 175 lb 0.7 oz (79.4 kg). GENERAL:  Well developed, well nourished, woman sitting comfortably in the exam room in no acute distress. MENTAL STATUS:  Alert and oriented to person, place and time. HEAD:  Shoulder length Crystallee Werden/silver hair.  Normocephalic, atraumatic, face symmetric, no Cushingoid features. EYES:  Glasses.  Blue eyes.  No conjunctivitis or scleral icterus. EXTREMITIES: Stable mild left leg edema with varicosities extending posteriorly on her calf.  NEUROLOGICAL: Unremarkable. PSYCH:  Appropriate.   No visits with results within 3 Day(s) from this visit.  Latest known visit with results is:  Appointment on 01/28/2018  Component Date Value Ref Range Status  . Protein S Ag, Total 01/28/2018 83  60 - 150 % Final   Comment: (NOTE) This test was developed and its performance characteristics determined by [facility]. It has not been cleared or approved by the Food and Drug Administration. Performed At: [facility] [place], [place] [id number] Rush Farmer MD [id number] Performed at [facility], [address], [place], [place] [address]   . Protein S Activity 01/28/2018 55* 63 - 140 % Final   Comment: (NOTE) A deficiency of protein S (PS), either congenital or acquired, increases the risk of thromboembolism. PS activity levels may be falsely low in individuals with APCR/Factor V Leiden. Consider performing free protein S antigen in those with APCR/Factor V Leiden before making a diagnosis of protein S deficiency. Acquired PS deficiency is more  common than congenital deficiency. PS values decrease with normal pregnancy, and are also dependent on age, sex and hormone status. PS values tend to be lower in a younger age group and lower in women than in men. Levels may be decreased in pre-menopausal women on oral contraceptive agents. Acquired deficiency can occur as a result of vitamin K deficiency or antagonism, severe hepatic disorders, (hepatitis, cirrhosis, etc.), nephrotic syndrome, inflammatory bowel disease, certain chemotherapeutic agents, L-asparaginse therapy, sepsis, disseminated intravascular coagulation (DIC) and acute thrombosis. Levels may be decreased in                           patients with polycythemia vera, sickle cell disease and essential thrombocythemia. Repeat evaluation on a new plasma sample to confirm or refute this result should be considered, after ruling out acquired causes, depending on the clinical scenario. Performed At: [facility] [place], [place] [id number] Rush Farmer MD [id number] Performed at [facility], [address] [place], [place] [address]   . Beta-2 Glyco I IgG  01/28/2018 <9  0 - 20 GPI IgG units Final   Comment: (NOTE) The reference interval reflects a 3SD or 99th percentile interval, which is thought to represent a potentially clinically significant result in accordance with the International Consensus Statement on the classification criteria for definitive antiphospholipid syndrome (APS). J Thromb Haem 2006;4:295-306.   . Beta-2-Glycoprotein I IgM 01/28/2018 <9  0 - 32 GPI IgM units Final   Comment: (NOTE) The reference interval reflects a 3SD or 99th percentile interval, which is thought to represent a potentially clinically significant result in accordance with the International Consensus Statement on the classification criteria for definitive antiphospholipid syndrome (APS). J Thromb Haem 2006;4:295-306. Performed At: [facility] [place], [place] [id number] Rush Farmer MD FT:[id number]   . Beta-2-Glycoprotein I IgA 01/28/2018 <9  0 - 25 GPI IgA units Final   Comment: (NOTE) The reference interval reflects a 3SD or 99th percentile interval, which is thought to represent a potentially clinically significant result in accordance with the International Consensus Statement on the classification criteria for definitive antiphospholipid syndrome (APS). J Thromb Haem 2006;4:295-306. Performed at [facility], [address] [place], [place] [address]   . Anticardiolipin IgG 01/28/2018 <9  0 - 14 GPL U/mL Final   Comment: (NOTE)                          Negative:              <15                          Indeterminate:     15 - 20                          Low-Med Positive: >20 - 80                          High Positive:         >80   . Anticardiolipin IgM 01/28/2018 <9  0 - 12 MPL U/mL Final   Comment: (NOTE)                          Negative:              <13                          Indeterminate:     13 - 20                          Low-Med Positive: >20 - 80                          High Positive:         >80 Performed At: [facility] [place], [place] [id number] Rush Farmer MD [id number] Performed at [facility], [address] [place], [place] [address]   . PTT Lupus Anticoagulant 01/28/2018 31.5  0.0 - 51.9 sec Final  . DRVVT 01/28/2018 42.3  0.0 - 47.0 sec Final  . Lupus Anticoag Interp 01/28/2018 Comment:   Corrected   Comment: (NOTE) No lupus anticoagulant was detected. Performed At: [facility] [address] [place], [place] [id number]  Rush Farmer MD [phone] Performed at [facility], [address], [place], [place] [address]     Assessment:  Dakiyah KARALINE BURESH is a 67 y.o. female with a recent history of left lower extremity superficial thrombosis.  She has a history of  left lower extremity DVT and bilateral pulmonary embolism in 2003.  DVT and PE were precipitated by hormone replacement therapy.  She was treated with Coumadin x several years then switched to Plavix in 2006 or 2007.  Bilateral lower extremity duplex on 10/19/2017 no evidence of DVT.  There was acute left superficial thrombosis of the varicosities or other superficial veins.  There were abnormalities consistent with sequela of a prior venous obstructive process, with findings that appear chronic/long-standing in nature toward identified in the proximal veins.  Labs in 09/2017 revealed a normal CBC with diff.  PTT was slightly elevated.  Hypercoagulable work-up on 11/05/2017 revealed a slightly low protein S activity of 57% (63% - 140%) and an indeterminant anticardiolipin antibody of 13%.   Normal studies included:  Factor V Leiden, prothrombin gene mutation, lupus anticoagulant panel, beta2-glycoprotein antibodies, protein C total was 113%, protein C activity 155%, protein S antigen 86%, ATIII activity 89%.  Lupus anticoagulant panel was negative.    Repeat hypercoagulable work up on 01/28/2018 revealed a slightly low protein S activity of 55% (63% - 140%). Normal studies included: prothrombin gene mutation,  lupus anticoagulant panel,  beta2-glycoprotein antibodies, and anticardiolipin antibodies. Protein S antigen 83%.   She has a family history of thrombosis.  Her sister has protein S deficiency.  Symptomatically, she continues to intermittent mild pain and swelling in the left lower extremity. She has no acute concerns. Patient denies episodes of claudication pain, shortness of breath, and chest pain. Exam reveals palpable varicosities. Labs reviewed.   Plan: 1. Review hypercoagulable work-up.  Discuss unclear significance of slightly low protein S activity. Previously indeterminant anticardiolipin antibody (IgM) now normal.  Discuss repeat testing. 2. Discuss potential need for re-imaging of  her LLE to assess for remaining clot. Patient describes resolving symptoms. Ultimately, she declined interval imaging. Patient advised to call the clinic for increased claudication pain, shortness of breath, and chest pain.  3. RTC in 3 months for MD assessment and labs (anticardiolipin antibodies, beta2 glycoprotein antibodies, protein S activity/antigen).   Honor Loh, NP  02/04/18, 11:01 AM

## 2018-02-04 ENCOUNTER — Inpatient Hospital Stay: Payer: Medicare Other | Admitting: Urgent Care

## 2018-02-04 ENCOUNTER — Other Ambulatory Visit: Payer: Self-pay | Admitting: *Deleted

## 2018-02-04 VITALS — BP 147/79 | HR 65 | Temp 97.5°F | Resp 18 | Wt 175.0 lb

## 2018-02-04 DIAGNOSIS — Z7902 Long term (current) use of antithrombotics/antiplatelets: Secondary | ICD-10-CM | POA: Diagnosis not present

## 2018-02-04 DIAGNOSIS — I82812 Embolism and thrombosis of superficial veins of left lower extremities: Secondary | ICD-10-CM

## 2018-02-04 DIAGNOSIS — Z86718 Personal history of other venous thrombosis and embolism: Secondary | ICD-10-CM

## 2018-02-04 DIAGNOSIS — Z86711 Personal history of pulmonary embolism: Secondary | ICD-10-CM

## 2018-02-04 NOTE — Progress Notes (Signed)
Patient offers no complaints today. 

## 2018-04-29 ENCOUNTER — Inpatient Hospital Stay: Payer: Medicare Other | Attending: Hematology and Oncology

## 2018-04-29 DIAGNOSIS — Z86711 Personal history of pulmonary embolism: Secondary | ICD-10-CM | POA: Insufficient documentation

## 2018-04-29 DIAGNOSIS — Z86718 Personal history of other venous thrombosis and embolism: Secondary | ICD-10-CM | POA: Insufficient documentation

## 2018-04-29 DIAGNOSIS — Z8249 Family history of ischemic heart disease and other diseases of the circulatory system: Secondary | ICD-10-CM | POA: Insufficient documentation

## 2018-04-29 DIAGNOSIS — I82812 Embolism and thrombosis of superficial veins of left lower extremities: Secondary | ICD-10-CM

## 2018-04-30 LAB — PROTEIN S, TOTAL: PROTEIN S AG TOTAL: 86 % (ref 60–150)

## 2018-04-30 LAB — PROTEIN S ACTIVITY: PROTEIN S ACTIVITY: 60 % — AB (ref 63–140)

## 2018-05-01 LAB — BETA-2-GLYCOPROTEIN I ABS, IGG/M/A
Beta-2 Glyco I IgG: <9 GPI IgG units (ref 0–20)
Beta-2-Glycoprotein I IgA: <9 GPI IgA units (ref 0–25)
Beta-2-Glycoprotein I IgM: <9 GPI IgM units (ref 0–32)

## 2018-05-01 LAB — CARDIOLIPIN ANTIBODIES, IGG, IGM, IGA
ANTICARDIOLIPIN IGM: 12 [MPL'U]/mL (ref 0–12)
Anticardiolipin IgA: <9 APL U/mL (ref 0–11)

## 2018-05-06 ENCOUNTER — Encounter: Payer: Self-pay | Admitting: Hematology and Oncology

## 2018-05-06 ENCOUNTER — Inpatient Hospital Stay (HOSPITAL_BASED_OUTPATIENT_CLINIC_OR_DEPARTMENT_OTHER): Payer: Medicare Other | Admitting: Hematology and Oncology

## 2018-05-06 ENCOUNTER — Other Ambulatory Visit: Payer: Self-pay

## 2018-05-06 VITALS — BP 134/78 | HR 68 | Temp 98.6°F | Resp 18 | Ht 66.0 in | Wt 173.1 lb

## 2018-05-06 DIAGNOSIS — D6859 Other primary thrombophilia: Secondary | ICD-10-CM | POA: Diagnosis not present

## 2018-05-06 DIAGNOSIS — Z86711 Personal history of pulmonary embolism: Secondary | ICD-10-CM | POA: Diagnosis not present

## 2018-05-06 DIAGNOSIS — I10 Essential (primary) hypertension: Secondary | ICD-10-CM

## 2018-05-06 DIAGNOSIS — I82812 Embolism and thrombosis of superficial veins of left lower extremities: Secondary | ICD-10-CM

## 2018-05-06 DIAGNOSIS — I825Y2 Chronic embolism and thrombosis of unspecified deep veins of left proximal lower extremity: Secondary | ICD-10-CM | POA: Diagnosis not present

## 2018-05-06 DIAGNOSIS — Z5181 Encounter for therapeutic drug level monitoring: Secondary | ICD-10-CM

## 2018-05-06 DIAGNOSIS — Z7902 Long term (current) use of antithrombotics/antiplatelets: Secondary | ICD-10-CM

## 2018-05-06 DIAGNOSIS — Z7982 Long term (current) use of aspirin: Secondary | ICD-10-CM

## 2018-05-06 DIAGNOSIS — Z7901 Long term (current) use of anticoagulants: Secondary | ICD-10-CM

## 2018-05-06 NOTE — Progress Notes (Addendum)
[facility] day:  05/06/2018   Chief Complaint: Angela Sparks is a 67 y.o. female with a history of DVT and PE and a history of superficial thrombosis who is seen for a 3 month assessment on Plavix.   HPI:  The patient was last seen in the hematology clinic on 02/04/2018 by Honor Loh, NP.  At that time, she continued to intermittent mild pain and swelling in the left lower extremity. She denied episodes of claudication pain, shortness of breath, and chest pain. Exam revealed palpable varicosities.  She declined lower extremity duplex.  Labs on 04/29/2018 revealed normal anticardiolipin antibodies and beta-2 glycoprotein antibodies.  Protein S functional was 60% (63-140%) and protein S antigen was 86% (60-150%).   During the interim, she has been active. She is going to the gym 4 days a week. Patient denies evidence of recurrent clots. Patient has chronic lower extremity and feet swelling. She denies claudication pain and shortness of breath. Patient denies that she has experienced any B symptoms. She denies any interval infections.   Patient presents with her sister today. Sister has a past medical history significant for DVT x 3. She is also on chronic anticoagulation therapy for a protein S deficiency.   Patient maintains an adequate appetite, and notes that she is eating well. Weight, compared to her last visit to the clinic, has decreased by 2 pounds.   Patient denies pain in the clinic today.   Past Medical History:  Diagnosis Date  . DVT (deep venous thrombosis) ([place])   . Hyperlipidemia   . Hypertension   . Multiple thyroid nodules   . PE (pulmonary embolism)     Past Surgical History:  Procedure Laterality Date  . BREAST BIOPSY Right 10/28/12   [place]  bx-neg  . BREAST BIOPSY Right 10/15/13   [place] bx/clip-neg  . COLONOSCOPY  2007  . DOPPLER ECHOCARDIOGRAPHY  07/07/2009  . NM MYOVIEW LTD  07/07/2009  . TRANSTHORACIC ECHOCARDIOGRAM   07/07/2009    Family History  Problem Relation Age of Onset  . Diabetes Mother   . Thyroid disease Mother   . Stroke Mother   . CAD Father   . Breast cancer Neg Hx     Social History:  reports that she has never smoked. She has never used smokeless tobacco. She reports that she does not drink alcohol or use drugs. Patient is a retired Photographer. She lives in [place].  The patient is accompanied by her sister, [place], today.  Allergies:  Allergies  Allergen Reactions  . Codeine Other (See Comments)    Current Medications: Current Outpatient Medications  Medication Sig Dispense Refill  . aspirin EC 81 MG tablet Take 1 tablet by mouth daily.    . clopidogrel (PLAVIX) 75 MG tablet Take 1 tablet (75 mg total) by mouth daily. NO MORE REFILLS W/O APPT 30 tablet 0  . Cyanocobalamin (VITAMIN B-12 PO) Take 1 tablet by mouth daily.    Marland Kitchen esomeprazole (NEXIUM) 20 MG capsule Take 1 capsule by mouth 2 (two) times daily. otc    . losartan (COZAAR) 50 MG tablet Take 1 tablet (50 mg total) by mouth daily. NO MORE REFILLS W/O APPT 30 tablet 0  . magnesium oxide (MAG-OX) 400 MG tablet Take 400 mg by mouth daily.    . metoprolol succinate (TOPROL XL) 25 MG 24 hr tablet Take 1 tablet (25 mg total) by mouth daily. NO MORE REFILLS W/O APPT 30 tablet  0   No current facility-administered medications for this visit.     Review of Systems  Constitutional: Positive for weight loss (down 2 pounds). Negative for diaphoresis, fever and malaise/fatigue.       More active. Exercising and going to the gym.  HENT: Negative.   Eyes: Negative.   Respiratory: Negative for cough, hemoptysis, sputum production and shortness of breath.   Cardiovascular: Positive for leg swelling (chronic). Negative for chest pain, palpitations, orthopnea and PND.       Chronic lower extremity venous varicosities.  Gastrointestinal: Negative for abdominal pain, blood in stool, constipation, diarrhea, melena, nausea  and vomiting.  Genitourinary: Negative for dysuria, frequency, hematuria and urgency.  Musculoskeletal: Negative for back pain, falls, joint pain and myalgias.  Skin: Negative for itching and rash.  Neurological: Negative for dizziness, tremors, weakness and headaches.  Endo/Heme/Allergies: Does not bruise/bleed easily.       On chronic anticoagulation therapy (Plavix). History of thyroid nodules.   Psychiatric/Behavioral: Negative for depression, memory loss and suicidal ideas. The patient is not nervous/anxious and does not have insomnia.   All other systems reviewed and are negative.  Performance status (ECOG): 1 - Symptomatic but completely ambulatory  Physical Exam: Blood pressure 134/78, pulse 68, temperature 98.6 F (37 C), temperature source Tympanic, resp. rate 18, height 5\' 6"  (1.676 m), weight 173 lb 1 oz (78.5 kg). GENERAL:  Well developed, well nourished, woman sitting comfortably in the exam room in no acute distress. MENTAL STATUS:  Alert and oriented to person, place and time. HEAD:  Shoulder length gray hair.  Normocephalic, atraumatic, face symmetric, no Cushingoid features. EYES:  Glasses.  Blue eyes.  Pupils equal round and reactive to light and accomodation.  No conjunctivitis or scleral icterus. ENT:  Oropharynx clear without lesion.  Tongue normal. Mucous membranes moist.  RESPIRATORY:  Clear to auscultation without rales, wheezes or rhonchi. CARDIOVASCULAR:  Regular rate and rhythm without murmur, rub or gallop. ABDOMEN:  Soft, non-tender, with active bowel sounds, and no hepatosplenomegaly.  No masses. SKIN:  No rashes, ulcers or lesions. EXTREMITIES: Mild left lower extremity edema (chronic).  Varicosities.  No skin discoloration or tenderness.  No palpable cords. LYMPH NODES: No palpable cervical, supraclavicular, axillary or inguinal adenopathy  NEUROLOGICAL: Unremarkable. PSYCH:  Appropriate.    No visits with results within 3 Day(s) from this visit.   Latest known visit with results is:  Appointment on 04/29/2018  Component Date Value Ref Range Status  . Beta-2 Glyco I IgG 04/29/2018 <9  0 - 20 GPI IgG units Final   Comment: (NOTE) The reference interval reflects a 3SD or 99th percentile interval, which is thought to represent a potentially clinically significant result in accordance with the International Consensus Statement on the classification criteria for definitive antiphospholipid syndrome (APS). J Thromb Haem 2006;4:295-306.   . Beta-2-Glycoprotein I IgM 04/29/2018 <9  0 - 32 GPI IgM units Final   Comment: (NOTE) The reference interval reflects a 3SD or 99th percentile interval, which is thought to represent a potentially clinically significant result in accordance with the International Consensus Statement on the classification criteria for definitive antiphospholipid syndrome (APS). J Thromb Haem 2006;4:295-306. Performed At: [facility] [place], [place] [id number] Rush Farmer MD WU:[id number]   . Beta-2-Glycoprotein I IgA 04/29/2018 <9  0 - 25 GPI IgA units Final   Comment: (NOTE) The reference interval reflects a 3SD or 99th percentile interval, which is thought to represent a potentially clinically significant result in accordance  with the International Consensus Statement on the classification criteria for definitive antiphospholipid syndrome (APS). J Thromb Haem 2006;4:295-306. Performed at [facility], [address], [place], [place] [address]   . Anticardiolipin IgG 04/29/2018 <9  0 - 14 GPL U/mL Final   Comment: (NOTE)                          Negative:              <15                          Indeterminate:     15 - 20                          Low-Med Positive: >20 - 80                          High Positive:         >80   . Anticardiolipin IgM 04/29/2018 12  0 - 12 MPL U/mL Final   Comment: (NOTE)                          Negative:              <13                           Indeterminate:     13 - 20                          Low-Med Positive: >20 - 80                          High Positive:         >80   . Anticardiolipin IgA 04/29/2018 <9  0 - 11 APL U/mL Final   Comment: (NOTE)                          Negative:              <12                          Indeterminate:     12 - 20                          Low-Med Positive: >20 - 80                          High Positive:         >80 Performed At: [facility] [address] [place], [place] [id number] Rush Farmer MD BT:[id number] Performed at [facility], [address] [place], [place] [address]   . Protein S Ag, Total 04/29/2018 86  60 - 150 % Final   Comment: (NOTE) This test was developed and its performance characteristics determined by [facility]. It has not been cleared or approved by the Food and Drug Administration. Performed At: [facility] [place], [place] [id number] Rush Farmer MD JJ:[id number] Performed at [facility], [address] [place],  [place] [address]   . Protein S Activity 04/29/2018 60* 63 - 140 % Final   Comment: (NOTE) A deficiency of protein S (PS), either congenital or acquired, increases the risk of thromboembolism. PS activity levels may be falsely low in individuals with APCR/Factor V Leiden. Consider performing free protein S antigen in those with APCR/Factor V Leiden before making a diagnosis of protein S deficiency. Acquired PS deficiency is more common than congenital deficiency. PS values decrease with normal pregnancy, and are also dependent on age, sex and hormone status. PS values tend to be lower in a younger age group and lower in women than in men. Levels may be decreased in pre-menopausal women on oral contraceptive agents. Acquired deficiency can occur as a result of vitamin K deficiency or antagonism, severe hepatic disorders, (hepatitis, cirrhosis, etc.), nephrotic  syndrome, inflammatory bowel disease, certain chemotherapeutic agents, L-asparaginse therapy, sepsis, disseminated intravascular coagulation (DIC) and acute thrombosis. Levels may be decreased in                           patients with polycythemia vera, sickle cell disease and essential thrombocythemia. Repeat evaluation on a new plasma sample to confirm or refute this result should be considered, after ruling out acquired causes, depending on the clinical scenario. Performed At: [facility] [place], [place] [id number] Rush Farmer MD QP:[id number] Performed at [facility], [address] [place], [place] [address]     Assessment:  CHALISA KOBLER is a 67 y.o. female with a recent history of left lower extremity superficial thrombosis.  She has a history of left lower extremity DVT and bilateral pulmonary embolism in 2003.  DVT and PE were precipitated by hormone replacement therapy.  She was treated with Coumadin x several years then switched to Plavix in 2006 or 2007.  Bilateral lower extremity duplex on 10/19/2017 no evidence of DVT.  There was acute left superficial thrombosis of the varicosities or other superficial veins.  There were abnormalities consistent with sequela of a prior venous obstructive process, with findings that appear chronic/long-standing in nature toward identified in the proximal veins.  Labs in 09/2017 revealed a normal CBC with diff.  PTT was slightly elevated.  Hypercoagulable work-up on 11/05/2017 revealed a slightly low protein S activity of 57% (63% - 140%) and an indeterminant anticardiolipin antibody of 13%.   Normal studies included:  Factor V Leiden, prothrombin gene mutation, lupus anticoagulant panel, beta2-glycoprotein antibodies, protein C total was 113%, protein C activity 155%, protein S antigen 86%, ATIII activity 89%.  Lupus anticoagulant panel was negative.    Repeat hypercoagulable work up on 01/28/2018  revealed a slightly low protein S activity of 55% (63% - 140%). Normal studies included: prothrombin gene mutation,  lupus anticoagulant panel,  beta2-glycoprotein antibodies, and anticardiolipin antibodies. Protein S antigen 83%.  Labs on 04/29/2018 revealed normal anticardiolipin antibodies and beta-2 glycoprotein antibodies.  Protein S activity was 60% (63-140%) and protein S antigen was 86% (60-150%).   She has a family history of thrombosis.  Her sister has protein S deficiency.  Symptomatically, she is doing well overall. She has chronic swelling in the lower extremities. She has no acute concerns. Patient denies episodes of claudication pain, shortness of breath, and chest pain.  Exam reveals palpable varicosities.   Plan: 1. Review labs from 04/29/2018.  Discuss persistent slightly low protein S activity (55-60%). Discuss "rocks in a basket" analogy. Previous thrombus was precipitated by oral  contraceptives. We reviewed factors that could precipitate thrombus formation (i.e. dehydration, cancer, known low protein S activity, pelvic or lower extremity surgery, and long car/plane trips). 2. Discuss need for repeat imaging of her lower extremities for any recurrent pain or increase in swelling.  Additionally, we discussed low threshold for CT imaging of the chest for episodes of chest pain and shortness of breath. Patient advised to call the clinic for increased claudication pain, shortness of breath, and chest pain.  Discuss life long anticoagulation if recurrent DVT documented. 3. Discuss anticardiolipin testing.  No evidence of a lupus anticoagulant. 4. Discuss sister's history of thrombosis.  She had 3 clots.  By report protein S antigen was 46%. 5. RTC in 6 months for MD assessment and labs (anticardiolipin antibodies, beta2 glycoprotein antibodies, protein S activity/antigen, D-dimer).   Honor Loh, NP  05/06/18, 11:34 AM   I saw and evaluated the patient, participating in the key  portions of the service and reviewing pertinent diagnostic studies and records.  I reviewed the nurse practitioner's note and agree with the findings and the plan.  The assessment and plan were discussed with the patient.  Several questions were asked by the patient and answered.   Nolon Stalls, MD 05/06/2018,11:34 AM

## 2018-05-11 ENCOUNTER — Other Ambulatory Visit: Payer: Self-pay | Admitting: Family Medicine

## 2018-05-11 DIAGNOSIS — Z1231 Encounter for screening mammogram for malignant neoplasm of breast: Secondary | ICD-10-CM

## 2018-05-19 ENCOUNTER — Ambulatory Visit
Admission: RE | Admit: 2018-05-19 | Discharge: 2018-05-19 | Disposition: A | Discharge status: Home / Self Care | Payer: Medicare Other | Source: Ambulatory Visit | Attending: Family Medicine | Admitting: Family Medicine

## 2018-05-19 DIAGNOSIS — Z1231 Encounter for screening mammogram for malignant neoplasm of breast: Secondary | ICD-10-CM | POA: Diagnosis present

## 2018-10-27 ENCOUNTER — Inpatient Hospital Stay: Payer: Medicare Other | Attending: Hematology and Oncology

## 2018-10-27 DIAGNOSIS — I825Y2 Chronic embolism and thrombosis of unspecified deep veins of left proximal lower extremity: Secondary | ICD-10-CM

## 2018-10-27 DIAGNOSIS — Z86711 Personal history of pulmonary embolism: Secondary | ICD-10-CM | POA: Diagnosis not present

## 2018-10-27 DIAGNOSIS — Z7902 Long term (current) use of antithrombotics/antiplatelets: Secondary | ICD-10-CM | POA: Insufficient documentation

## 2018-10-27 DIAGNOSIS — I10 Essential (primary) hypertension: Secondary | ICD-10-CM | POA: Diagnosis not present

## 2018-10-27 DIAGNOSIS — Z79899 Other long term (current) drug therapy: Secondary | ICD-10-CM | POA: Insufficient documentation

## 2018-10-27 DIAGNOSIS — Z86718 Personal history of other venous thrombosis and embolism: Secondary | ICD-10-CM | POA: Diagnosis present

## 2018-10-27 DIAGNOSIS — I82812 Embolism and thrombosis of superficial veins of left lower extremities: Secondary | ICD-10-CM

## 2018-10-27 DIAGNOSIS — Z7982 Long term (current) use of aspirin: Secondary | ICD-10-CM | POA: Insufficient documentation

## 2018-10-28 LAB — PROTEIN S, TOTAL: Protein S Ag, Total: 87 % (ref 60–150)

## 2018-10-28 LAB — PROTEIN S ACTIVITY: PROTEIN S ACTIVITY: 64 % (ref 63–140)

## 2018-10-29 ENCOUNTER — Other Ambulatory Visit: Payer: Medicare Other

## 2018-10-29 LAB — CARDIOLIPIN ANTIBODIES, IGG, IGM, IGA
Anticardiolipin IgA: <9 APL U/mL (ref 0–11)
Anticardiolipin IgG: <9 GPL U/mL (ref 0–14)
Anticardiolipin IgM: 11 MPL U/mL (ref 0–12)

## 2018-11-04 ENCOUNTER — Encounter: Payer: Self-pay | Admitting: Hematology and Oncology

## 2018-11-04 ENCOUNTER — Other Ambulatory Visit: Payer: Medicare Other

## 2018-11-04 ENCOUNTER — Inpatient Hospital Stay: Payer: Medicare Other | Admitting: Hematology and Oncology

## 2018-11-04 VITALS — BP 154/71 | HR 77 | Temp 97.8°F | Resp 20 | Wt 174.8 lb

## 2018-11-04 DIAGNOSIS — Z86718 Personal history of other venous thrombosis and embolism: Secondary | ICD-10-CM

## 2018-11-04 DIAGNOSIS — I82812 Embolism and thrombosis of superficial veins of left lower extremities: Secondary | ICD-10-CM

## 2018-11-04 DIAGNOSIS — I10 Essential (primary) hypertension: Secondary | ICD-10-CM

## 2018-11-04 DIAGNOSIS — Z86711 Personal history of pulmonary embolism: Secondary | ICD-10-CM

## 2018-11-04 DIAGNOSIS — Z7902 Long term (current) use of antithrombotics/antiplatelets: Secondary | ICD-10-CM

## 2018-11-04 DIAGNOSIS — Z7982 Long term (current) use of aspirin: Secondary | ICD-10-CM

## 2018-11-04 DIAGNOSIS — Z79899 Other long term (current) drug therapy: Secondary | ICD-10-CM

## 2018-11-04 NOTE — Progress Notes (Signed)
[facility] day:  11/04/2018   Chief Complaint: Angela Sparks is a 67 y.o. female with a history of DVT and PE and a history of superficial thrombosis who is seen for a 6 month assessment on Plavix.   HPI:  The patient was last seen in the hematology clinic on 05/06/2018.  At that time, she was doing well overall. She had chronic swelling in the lower extremities. She had no acute concerns. Patient denied episodes of claudication pain, shortness of breath, and chest pain.  Exam revealed palpable varicosities.   Labs on 10/27/2018 revealed negative anti-cardiolipin antibodies.  Protein S antigen was 87%.  Protein S activity was 64%.  During the interim, patient has been doing well. She denies any acute concerns. Patient's only complains that her feet "get hot and burn" due to "poor circulation". Patient was evaluated for neuropathy 2 years ago and was advised that she did not have it. She continues to have intermittent episodes of swelling in her BILATERAL lower extremities, which resolves with elevation.   Patient denies any episodes of chest pain or shortness of breath. She denies claudication pain in her lower extremities. No bruising or bleeding. She remains on DAPT with ASA and clopidogrel. She was originally started on clopidogrel by cardiology "for both my clots and my a.fib".    Patient denies that she has experienced any B symptoms. She denies any interval infections. Patient advises that she maintains an adequate appetite. She is eating well. Weight today is 174 lb 13.2 oz (79.3 kg), which compared to her last visit to the clinic, represents a 1 pound increase.   Patient denies pain in the clinic today.   Past Medical History:  Diagnosis Date  . DVT (deep venous thrombosis) ([place])   . Hyperlipidemia   . Hypertension   . Multiple thyroid nodules   . PE (pulmonary embolism)     Past Surgical History:  Procedure Laterality Date  .  BREAST BIOPSY Right 10/28/12   [place]  bx-neg  . BREAST BIOPSY Right 10/15/13   [place] bx/clip-neg  . COLONOSCOPY  2007  . DOPPLER ECHOCARDIOGRAPHY  07/07/2009  . NM MYOVIEW LTD  07/07/2009  . TRANSTHORACIC ECHOCARDIOGRAM  07/07/2009    Family History  Problem Relation Age of Onset  . Diabetes Mother   . Thyroid disease Mother   . Stroke Mother   . CAD Father   . Breast cancer Neg Hx     Social History:  reports that she has never smoked. She has never used smokeless tobacco. She reports that she does not drink alcohol or use drugs. 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 Her sister's name is Hope. The patient is alone today.  Allergies:  Allergies  Allergen Reactions  . Codeine Other (See Comments)    Current Medications: Current Outpatient Medications  Medication Sig Dispense Refill  . aspirin EC 81 MG tablet Take 1 tablet by mouth daily.    . clopidogrel (PLAVIX) 75 MG tablet Take 1 tablet (75 mg total) by mouth daily. NO MORE REFILLS W/O APPT 30 tablet 0  . Cyanocobalamin (VITAMIN B-12 PO) Take 1 tablet by mouth daily.    . ergocalciferol (VITAMIN D2) 1.25 MG (50000 UT) capsule Take 1,200 Units by mouth daily.    Marland Kitchen esomeprazole (NEXIUM) 20 MG capsule Take 1 capsule by mouth 2 (two) times daily. otc    . losartan (COZAAR) 50 MG tablet Take 1 tablet (  50 mg total) by mouth daily. NO MORE REFILLS W/O APPT 30 tablet 0  . magnesium oxide (MAG-OX) 400 MG tablet Take 400 mg by mouth daily.    . metoprolol succinate (TOPROL XL) 25 MG 24 hr tablet Take 1 tablet (25 mg total) by mouth daily. NO MORE REFILLS W/O APPT 30 tablet 0   No current facility-administered medications for this visit.     Review of Systems  Constitutional: Negative for chills, diaphoresis, fever, malaise/fatigue and weight loss (up 1 pound).       Feels "good".  HENT: Negative.  Negative for congestion, ear pain, nosebleeds, sinus pain and sore throat.   Eyes: Negative.  Negative  for blurred vision, double vision and photophobia.  Respiratory: Negative.  Negative for cough, hemoptysis, sputum production and shortness of breath.   Cardiovascular: Positive for leg swelling (chronic). Negative for chest pain, palpitations, orthopnea and PND.       Chronic lower extremity venous varicosities.  Gastrointestinal: Negative.  Negative for abdominal pain, blood in stool, constipation, diarrhea, melena, nausea and vomiting.  Genitourinary: Negative.  Negative for dysuria, frequency, hematuria and urgency.  Musculoskeletal: Negative.  Negative for back pain, falls, joint pain, myalgias and neck pain.  Skin: Negative.  Negative for itching and rash.  Neurological: Positive for sensory change (feet). Negative for dizziness, tremors, focal weakness, weakness and headaches.  Endo/Heme/Allergies: Does not bruise/bleed easily.       Chronic anticoagulation therapy (Plavix). History of thyroid nodules.   Psychiatric/Behavioral: Negative.  Negative for depression and memory loss. The patient is not nervous/anxious and does not have insomnia.   All other systems reviewed and are negative.  Performance status (ECOG): 1  Physical Exam: Blood pressure (!) 154/71, pulse 77, temperature 97.8 F (36.6 C), temperature source Tympanic, resp. rate 20, weight 174 lb 13.2 oz (79.3 kg), SpO2 98 %. GENERAL:  Well developed, well nourished, woman sitting comfortably in the exam room in no acute distress. MENTAL STATUS:  Alert and oriented to person, place and time. HEAD:  Short gray hair.  Normocephalic, atraumatic, face symmetric, no Cushingoid features. EYES:  Glasses.  Blue eyes.  Pupils equal round and reactive to light and accomodation.  No conjunctivitis or scleral icterus. ENT:  Oropharynx clear without lesion.  Tongue normal. Mucous membranes moist.  RESPIRATORY:  Clear to auscultation without rales, wheezes or rhonchi. CARDIOVASCULAR:  Regular rate and rhythm without murmur, rub or  gallop. ABDOMEN:  Soft, non-tender, with active bowel sounds, and no hepatosplenomegaly.  No masses. SKIN:  No rashes, ulcers or lesions. EXTREMITIES: Chronic lower extremity edema.  Varicosities.  No skin discoloration or tenderness.  No palpable cords. LYMPH NODES: No palpable cervical, supraclavicular, axillary or inguinal adenopathy  NEUROLOGICAL: Unremarkable. PSYCH:  Appropriate.    No visits with results within 3 Day(s) from this visit.  Latest known visit with results is:  Appointment on 10/27/2018  Component Date Value Ref Range Status  . Protein S Ag, Total 10/27/2018 87  60 - 150 % Final   Comment: (NOTE) This test was developed and its performance characteristics determined by [facility]. It has not been cleared or approved by the Food and Drug Administration. Performed At: [facility] [place], [place] [id number] Rush Farmer MD EL:[id number]   . Protein S Activity 10/27/2018 64  63 - 140 % Final   Comment: (NOTE) Protein S activity may be falsely increased (masking an abnormal, low result) in patients receiving direct Xa inhibitor (e.g., rivaroxaban, apixaban, edoxaban) or  a direct thrombin inhibitor (e.g., dabigatran) anticoagulant treatment due to assay interference by these drugs. Performed At: [facility] [place], [place] [id number] Rush Farmer MD NI:[id number]   . Anticardiolipin IgG 10/27/2018 <9  0 - 14 GPL U/mL Final   Comment: (NOTE)                          Negative:              <15                          Indeterminate:     15 - 20                          Low-Med Positive: >20 - 80                          High Positive:         >80   . Anticardiolipin IgM 10/27/2018 11  0 - 12 MPL U/mL Final   Comment: (NOTE)                          Negative:              <13                          Indeterminate:     13 - 20                          Low-Med Positive: >20 - 80                          High  Positive:         >80   . Anticardiolipin IgA 10/27/2018 <9  0 - 11 APL U/mL Final   Comment: (NOTE)                          Negative:              <12                          Indeterminate:     12 - 20                          Low-Med Positive: >20 - 80                          High Positive:         >80 Performed At: [facility] [place], [place] [id number] Rush Farmer MD IR:[id number]     Assessment:  ISIDRA MINGS is a 67 y.o. female with a recent history of left lower extremity superficial thrombosis.  She has a history of left lower extremity DVT and bilateral pulmonary embolism in 2003.  DVT and PE were precipitated by hormone replacement therapy.  She was treated with Coumadin x several years then switched to Plavix in 2006 or 2007.  Bilateral lower extremity duplex on 10/19/2017 no evidence of DVT.  There was acute left  superficial thrombosis of the varicosities or other superficial veins.  There were abnormalities consistent with sequela of a prior venous obstructive process, with findings that appear chronic/long-standing in nature toward identified in the proximal veins.  Labs in 09/2017 revealed a normal CBC with diff.  PTT was slightly elevated.  Hypercoagulable work-up on 11/05/2017 revealed a slightly low protein S activity of 57% (63% - 140%) and an indeterminant anticardiolipin antibody of 13%.   Normal studies included:  Factor V Leiden, prothrombin gene mutation, lupus anticoagulant panel, beta2-glycoprotein antibodies, protein C total was 113%, protein C activity 155%, protein S antigen 86%, ATIII activity 89%.  Lupus anticoagulant panel was negative.  Anti-cardiolipin antibodies were negative on 10/27/2018.  Repeat hypercoagulable work up on 01/28/2018 revealed a slightly low protein S activity of 55% (63% - 140%). Normal studies included: prothrombin gene mutation,  lupus anticoagulant panel,  beta2-glycoprotein antibodies, and  anticardiolipin antibodies. Protein S antigen 83%.  Labs on 04/29/2018 revealed normal anticardiolipin antibodies and beta-2 glycoprotein antibodies.  Protein S activity was 60% (63-140%) and protein S antigen was 86% (60-150%).    Protein S antigen was 87% and protein S activity was 64% on 10/27/2018.  She has a family history of thrombosis.  Her sister has protein S deficiency.  Symptomatically, she notes that her feet "get hot and burn" due to "poor circulation".  She has lower extremity changes.   Plan: 1. Review labs from 10/27/2018. 2. Superficial thrombosis of varicosities (2018):  Patient on Plavix. 3.   History of DVT and PE (2003):  Event precipitated by hormone replacement therapy.  Protein S activity and antigen were normal on 10/27/2018.  Anticardiolipin antibodies were normal on 10/27/2018.  Treated with Coumadin x several years (discontinued 2006/2007).  Discuss plan for long term anticoagulation if recurrent DVT documented. 4.  RTC in 6 months for MD assessment and labs (CBC with diff, CMP).    Honor Loh, NP  11/04/18, 11:35 AM   I saw and evaluated the patient, participating in the key portions of the service and reviewing pertinent diagnostic studies and records.  I reviewed the nurse practitioner's note and agree with the findings and the plan.  The assessment and plan were discussed with the patient.  Several questions were asked by the patient and answered.   Nolon Stalls, MD 11/04/2018,11:35 AM

## 2018-11-04 NOTE — Progress Notes (Signed)
Pt denies any complaints at this time

## 2019-03-10 IMAGING — MG MM DIGITAL SCREENING BILAT W/ TOMO W/ CAD
8 series · 8 of 24 positions shown · non-contrast
Comparison: Previous exam(s).

CLINICAL DATA: Screening.

EXAM:
DIGITAL SCREENING BILATERAL MAMMOGRAM WITH TOMO AND CAD

[R MLO synth-2D]
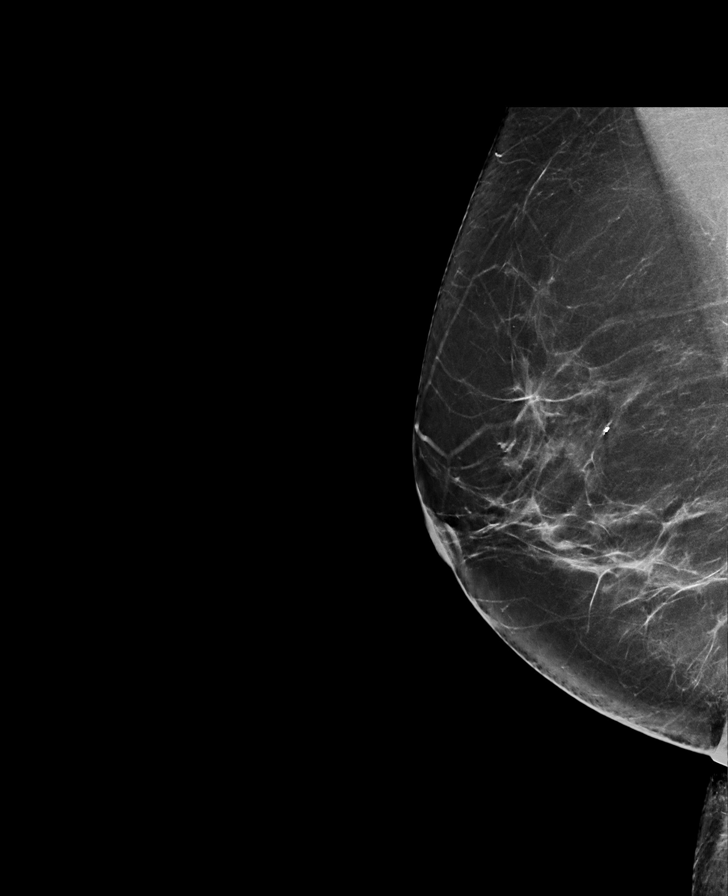

[R CC synth-2D]
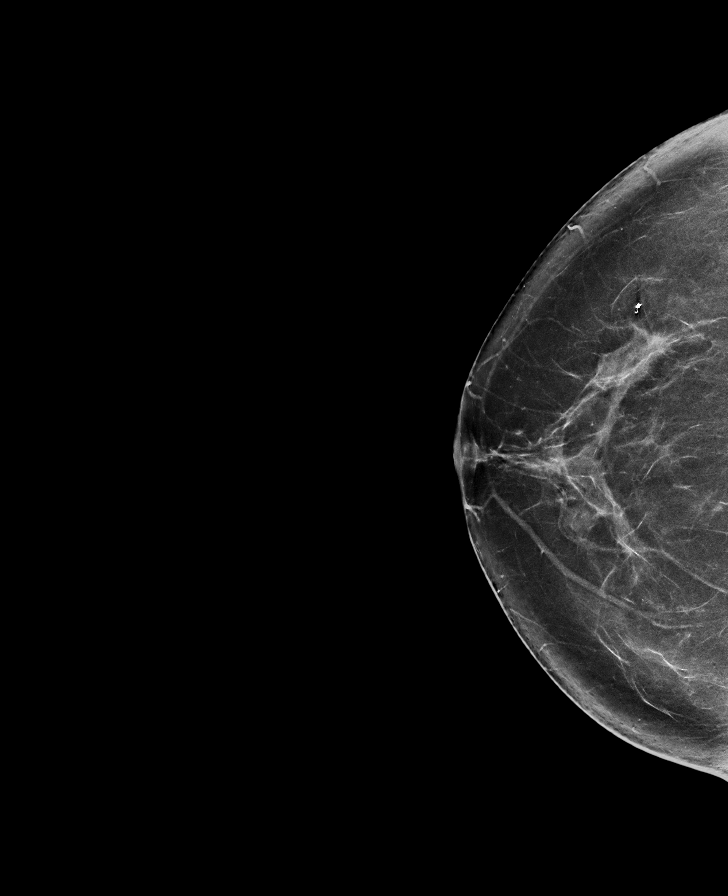

[L CC synth-2D]
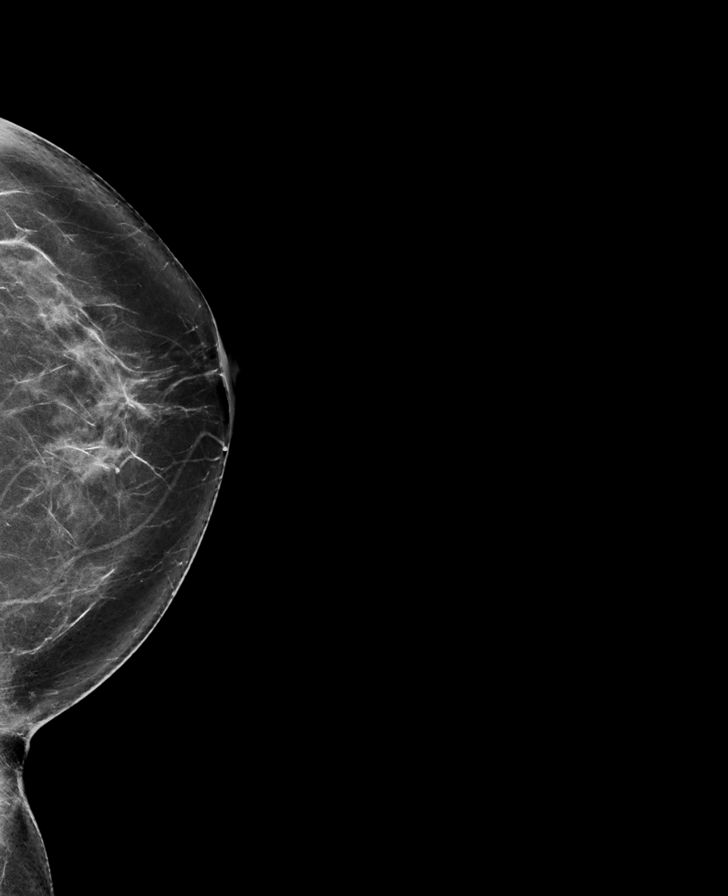

[L MLO synth-2D]
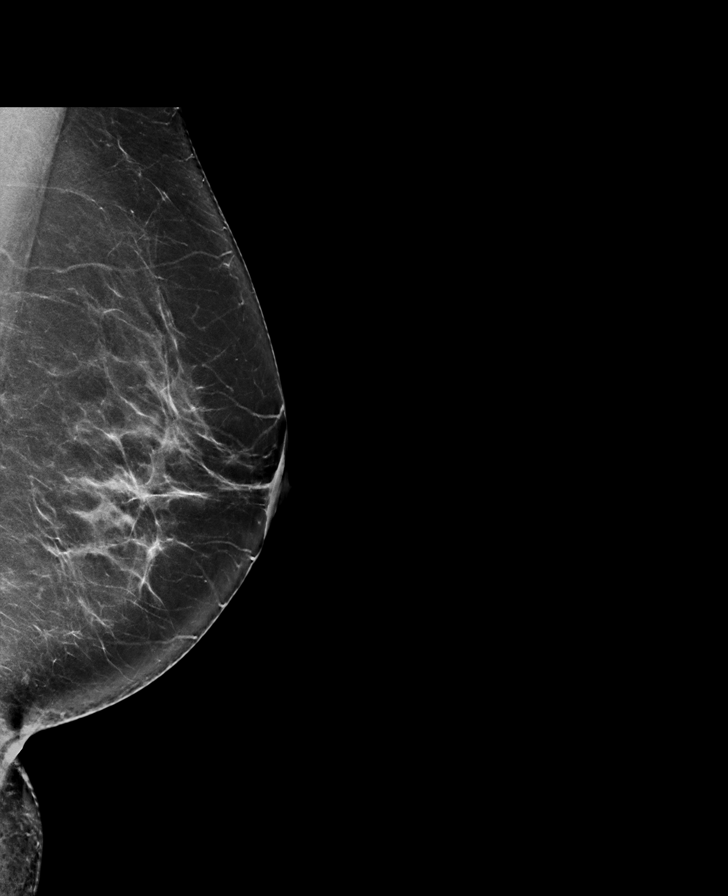

[L MLO tomo · tomo slice 40/79.0]
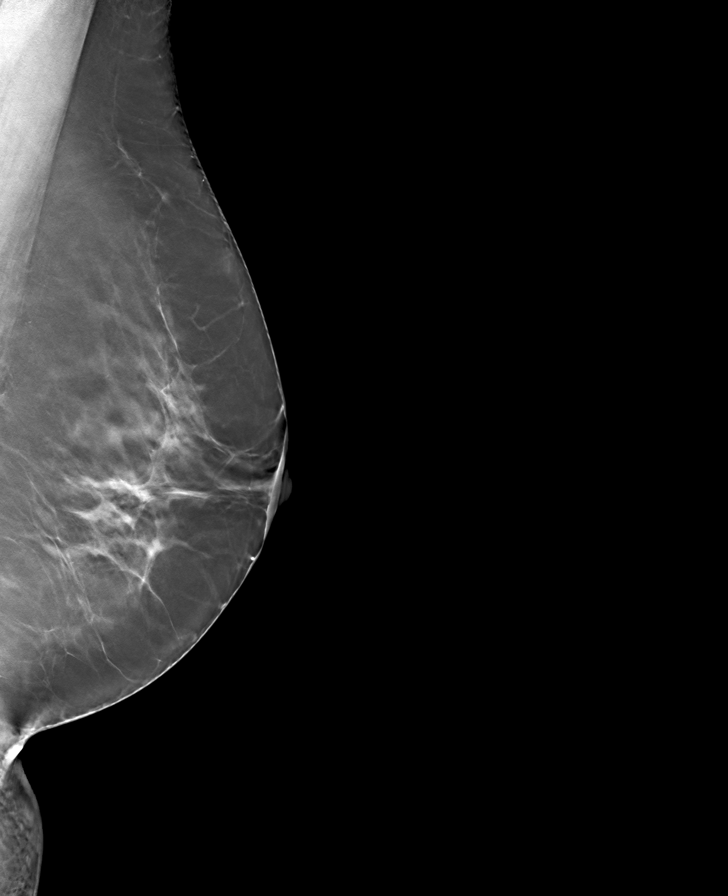

[R MLO tomo · tomo slice 41/81.0]
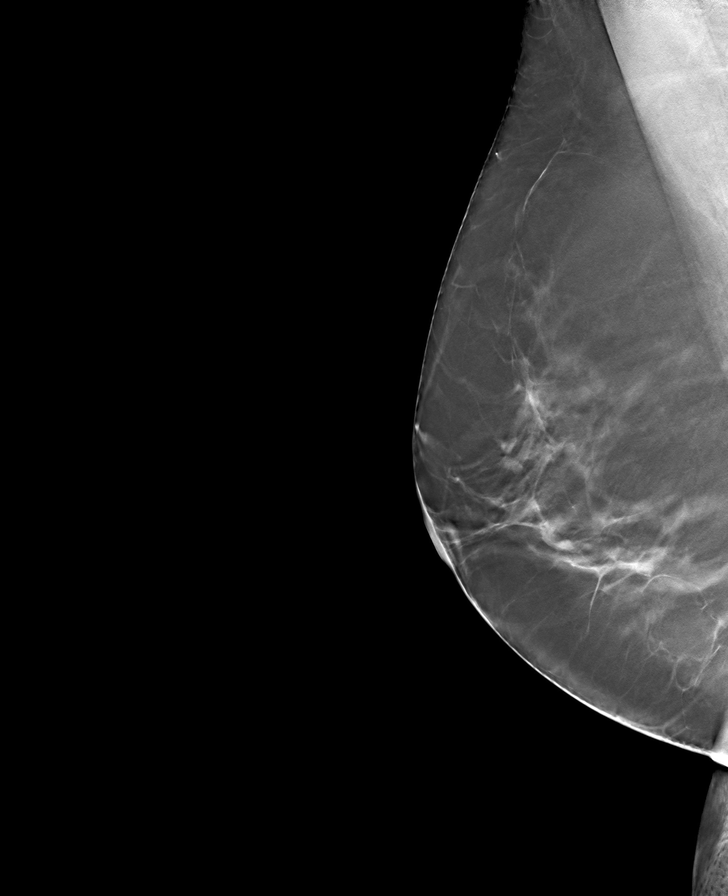

[R CC tomo · tomo slice 41/82.0]
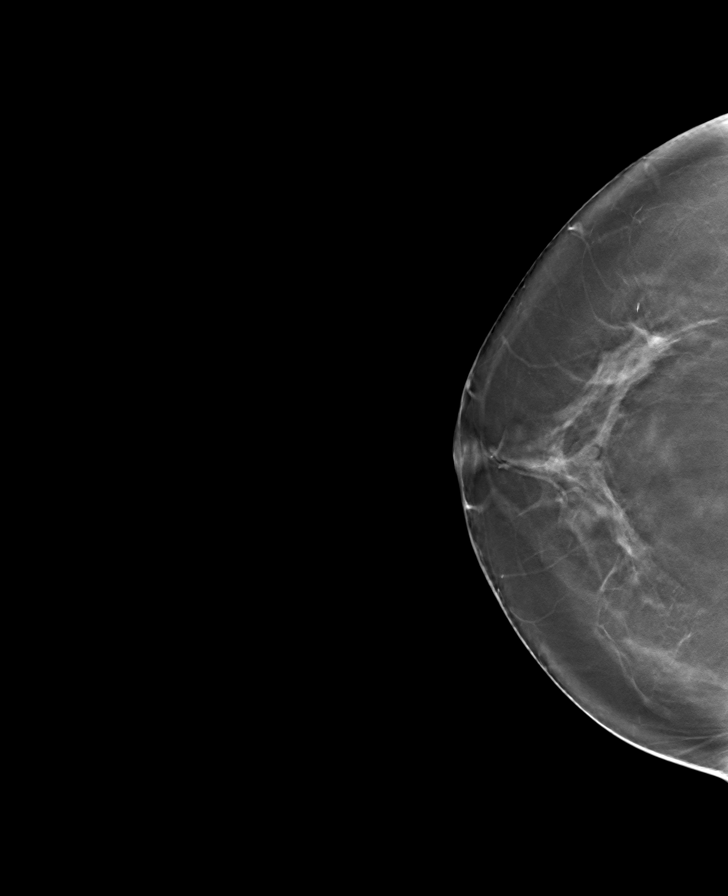

[L CC tomo · tomo slice 39/78.0]
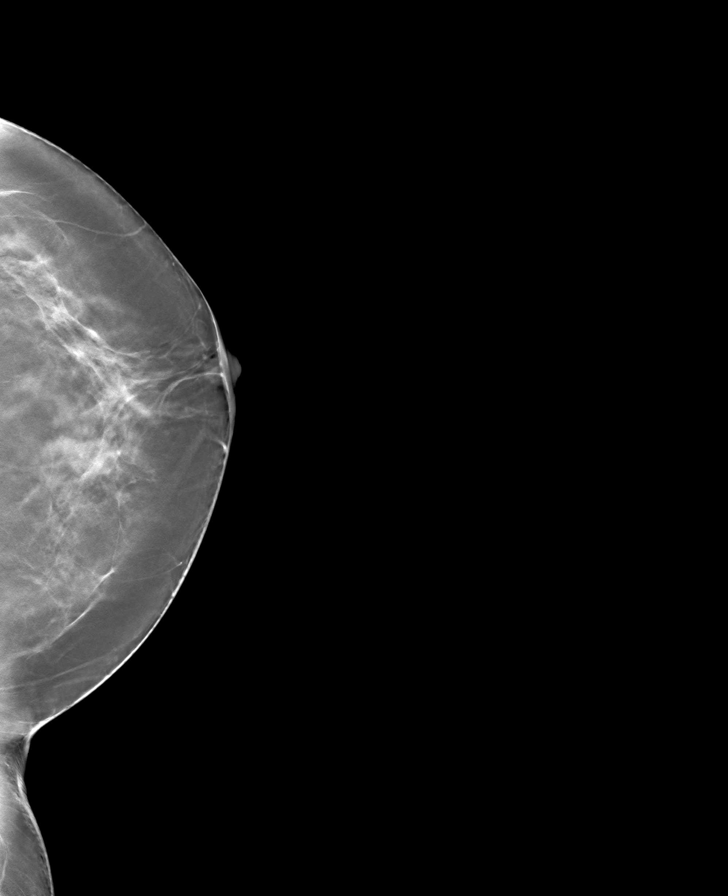

[8 of 24 positions shown; findings below may reference images not displayed]

ACR Breast Density Category b: There are scattered areas of
fibroglandular density.
FINDINGS: There are no findings suspicious for malignancy. Images were
processed with CAD.
IMPRESSION: No mammographic evidence of malignancy. A result letter of this
screening mammogram will be mailed directly to the patient.

RECOMMENDATION:
Screening mammogram in one year. (Code:[id number])

BI-RADS CATEGORY  1: Negative.

## 2019-04-29 ENCOUNTER — Other Ambulatory Visit: Payer: Self-pay

## 2019-04-30 ENCOUNTER — Other Ambulatory Visit: Payer: Medicare Other

## 2019-05-04 ENCOUNTER — Inpatient Hospital Stay: Payer: Medicare Other | Attending: Hematology and Oncology

## 2019-05-04 DIAGNOSIS — I10 Essential (primary) hypertension: Secondary | ICD-10-CM | POA: Diagnosis not present

## 2019-05-04 DIAGNOSIS — I82812 Embolism and thrombosis of superficial veins of left lower extremities: Secondary | ICD-10-CM

## 2019-05-04 DIAGNOSIS — Z86718 Personal history of other venous thrombosis and embolism: Secondary | ICD-10-CM | POA: Diagnosis present

## 2019-05-04 DIAGNOSIS — Z79899 Other long term (current) drug therapy: Secondary | ICD-10-CM | POA: Diagnosis not present

## 2019-05-04 DIAGNOSIS — Z7982 Long term (current) use of aspirin: Secondary | ICD-10-CM | POA: Diagnosis not present

## 2019-05-04 DIAGNOSIS — Z7902 Long term (current) use of antithrombotics/antiplatelets: Secondary | ICD-10-CM | POA: Insufficient documentation

## 2019-05-04 LAB — COMPREHENSIVE METABOLIC PANEL
ALT: 26 U/L (ref 0–44)
AST: 25 U/L (ref 15–41)
Albumin: 4.2 g/dL (ref 3.5–5.0)
Alkaline Phosphatase: 55 U/L (ref 38–126)
Anion gap: 11 (ref 5–15)
BUN: 16 mg/dL (ref 8–23)
CO2: 26 mmol/L (ref 22–32)
Calcium: 9.1 mg/dL (ref 8.9–10.3)
Chloride: 103 mmol/L (ref 98–111)
Creatinine, Ser: 1.06 mg/dL — ABNORMAL HIGH (ref 0.44–1.00)
GFR calc Af Amer: >60 mL/min (ref >60)
GFR calc non Af Amer: 54 mL/min — ABNORMAL LOW (ref >60)
Glucose, Bld: 131 mg/dL — ABNORMAL HIGH (ref 70–99)
Potassium: 4.3 mmol/L (ref 3.5–5.1)
Sodium: 140 mmol/L (ref 135–145)
Total Bilirubin: 1.1 mg/dL (ref 0.3–1.2)
Total Protein: 7.6 g/dL (ref 6.5–8.1)

## 2019-05-04 LAB — CBC WITH DIFFERENTIAL/PLATELET
Abs Immature Granulocytes: 0.04 10*3/uL (ref 0.00–0.07)
Basophils Absolute: 0 10*3/uL (ref 0.0–0.1)
Basophils Relative: 0 %
Eosinophils Absolute: 0.2 10*3/uL (ref 0.0–0.5)
Eosinophils Relative: 3 %
HCT: 42.1 % (ref 36.0–46.0)
Hemoglobin: 14 g/dL (ref 12.0–15.0)
Immature Granulocytes: 1 %
Lymphocytes Relative: 30 %
Lymphs Abs: 2.3 10*3/uL (ref 0.7–4.0)
MCH: 30.4 pg (ref 26.0–34.0)
MCHC: 33.3 g/dL (ref 30.0–36.0)
MCV: 91.5 fL (ref 80.0–100.0)
Monocytes Absolute: 0.5 10*3/uL (ref 0.1–1.0)
Monocytes Relative: 7 %
Neutro Abs: 4.6 10*3/uL (ref 1.7–7.7)
Neutrophils Relative %: 59 %
Platelets: 194 10*3/uL (ref 150–400)
RBC: 4.6 MIL/uL (ref 3.87–5.11)
RDW: 13.7 % (ref 11.5–15.5)
WBC: 7.7 10*3/uL (ref 4.0–10.5)
nRBC: 0 % (ref 0.0–0.2)

## 2019-05-05 ENCOUNTER — Other Ambulatory Visit: Payer: Self-pay

## 2019-05-05 NOTE — Progress Notes (Signed)
[facility]  [address], [address] [place], [place] [address] Phone: [phone]  Fax: [phone]   Telemedicine Office Visit:  05/06/2019  Referring physician: Juline Patch, MD  I connected with Angela Sparks on 05/06/2019 at 4:07 PM by videoconferencing and verified that I was speaking with the correct person using 2 identifiers.  The patient was at home.  I discussed the limitations, risk, security and privacy concerns of performing an evaluation and management service by videoconferencing and the availability of in person appointments.  I also discussed with the patient that there may be a patient responsible charge related to this service.  The patient expressed understanding and agreed to proceed.   Chief Complaint: Angela Sparks is a 68 y.o. female with a history of DVT and PE and a history of superficial thrombosis who is seen for a 6 month assessment on Plavix.   HPI: The patient was last seen in the hematology clinic on 11/04/2018. At that time, she noted that her feet "get hot and burn" due to "poor circulation".  She had lower extremity changes.   Labs on 05/04/2019 reviewed: WBC 7,000, hemoglobin 14.0, hematocrit 42.1, platelets 194,000. Creatinine 1.06.   During the interim, the patient reports her legs have not improved but are stable. She has not yet seen vascular surgery.   She continues on Plavix. She has normal bruising and bleeding. She has occasional lower extremity edema when she is on her feet a lot. She reports she probably has gained weight during the COVID-19 pandemic but has not weighed herself.    Past Medical History:  Diagnosis Date   DVT (deep venous thrombosis) ([facility])    Hyperlipidemia    Hypertension    Multiple thyroid nodules    PE (pulmonary embolism)     Past Surgical History:  Procedure Laterality Date   BREAST BIOPSY Right 10/28/12   [place]  bx-neg   BREAST BIOPSY Right 10/15/13   [place] bx/clip-neg    COLONOSCOPY  2007   DOPPLER ECHOCARDIOGRAPHY  07/07/2009   NM MYOVIEW LTD  07/07/2009   TRANSTHORACIC ECHOCARDIOGRAM  07/07/2009    Family History  Problem Relation Age of Onset   Diabetes Mother    Thyroid disease Mother    Stroke Mother    CAD Father    Breast cancer Neg Hx     Social History:  reports that she has never smoked. She has never used smokeless tobacco. 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 Her sister's name is Hope. The patient is alone today.  Participants in the patient's visit and their role in the encounter included the patient Waymon Budge, RN today.  The intake visit was provided by Waymon Budge, RN.  Allergies:  Allergies  Allergen Reactions   Codeine Other (See Comments)    Current Medications: Current Outpatient Medications  Medication Sig Dispense Refill   aspirin EC 81 MG tablet Take 1 tablet by mouth daily.     clopidogrel (PLAVIX) 75 MG tablet Take 1 tablet (75 mg total) by mouth daily. NO MORE REFILLS W/O APPT 30 tablet 0   Cyanocobalamin (VITAMIN B-12 PO) Take 1 tablet by mouth daily.     ergocalciferol (VITAMIN D2) 1.25 MG (50000 UT) capsule Take 1,200 Units by mouth daily.     esomeprazole (NEXIUM) 20 MG capsule Take 1 capsule by mouth 2 (two) times daily. otc  losartan (COZAAR) 50 MG tablet Take 1 tablet (50 mg total) by mouth daily. NO MORE REFILLS W/O APPT 30 tablet 0   magnesium oxide (MAG-OX) 400 MG tablet Take 400 mg by mouth daily.     metoprolol succinate (TOPROL XL) 25 MG 24 hr tablet Take 1 tablet (25 mg total) by mouth daily. NO MORE REFILLS W/O APPT 30 tablet 0   No current facility-administered medications for this visit.     Review of Systems  Constitutional: Negative for chills, diaphoresis, fever, malaise/fatigue and weight loss.       Feels "good".  HENT: Negative.  Negative for congestion, ear pain,  nosebleeds, sinus pain and sore throat.   Eyes: Negative.  Negative for blurred vision, double vision and photophobia.  Respiratory: Negative.  Negative for cough, hemoptysis, sputum production and shortness of breath.   Cardiovascular: Positive for leg swelling (chronic). Negative for chest pain, palpitations, orthopnea and PND.       Chronic lower extremity venous varicosities.  Gastrointestinal: Negative.  Negative for abdominal pain, blood in stool, constipation, diarrhea, melena, nausea and vomiting.  Genitourinary: Negative.  Negative for dysuria, frequency, hematuria and urgency.  Musculoskeletal: Negative.  Negative for back pain, falls, joint pain, myalgias and neck pain.  Skin: Negative.  Negative for itching and rash.  Neurological: Positive for sensory change (feet and legs). Negative for dizziness, tremors, focal weakness, weakness and headaches.  Endo/Heme/Allergies: Does not bruise/bleed easily.       Chronic anticoagulation therapy (Plavix). History of thyroid nodules.   Psychiatric/Behavioral: Negative.  Negative for depression and memory loss. The patient is not nervous/anxious and does not have insomnia.   All other systems reviewed and are negative.   Performance status (ECOG): 1  Physical Exam  Constitutional: She is oriented to person, place, and time. She appears well-developed and well-nourished. No distress.  HENT:  Short gray hair.   Eyes: Pupils are equal, round, and reactive to light. Conjunctivae and EOM are normal. No scleral icterus.  Glasses.  Blue eyes.  Neurological: She is alert and oriented to person, place, and time.  Psychiatric: She has a normal mood and affect. Her behavior is normal. Judgment and thought content normal.  Nursing note reviewed.   Appointment on 05/04/2019  Component Date Value Ref Range Status   Sodium 05/04/2019 140  135 - 145 mmol/L Final   Potassium 05/04/2019 4.3  3.5 - 5.1 mmol/L Final   Chloride 05/04/2019 103  98 - 111  mmol/L Final   CO2 05/04/2019 26  22 - 32 mmol/L Final   Glucose, Bld 05/04/2019 131* 70 - 99 mg/dL Final   BUN 05/04/2019 16  8 - 23 mg/dL Final   Creatinine, Ser 05/04/2019 1.06* 0.44 - 1.00 mg/dL Final   Calcium 05/04/2019 9.1  8.9 - 10.3 mg/dL Final   Total Protein 05/04/2019 7.6  6.5 - 8.1 g/dL Final   Albumin 05/04/2019 4.2  3.5 - 5.0 g/dL Final   AST 05/04/2019 25  15 - 41 U/L Final   ALT 05/04/2019 26  0 - 44 U/L Final   Alkaline Phosphatase 05/04/2019 55  38 - 126 U/L Final   Total Bilirubin 05/04/2019 1.1  0.3 - 1.2 mg/dL Final   GFR calc non Af Amer 05/04/2019 54* >60 mL/min Final   GFR calc Af Amer 05/04/2019 >60  >60 mL/min Final   Anion gap 05/04/2019 11  5 - 15 Final   Performed at [facility], [address], [place], [place]  [address]   WBC 05/04/2019 7.7  4.0 - 10.5 K/uL Final   RBC 05/04/2019 4.60  3.87 - 5.11 MIL/uL Final   Hemoglobin 05/04/2019 14.0  12.0 - 15.0 g/dL Final   HCT 05/04/2019 42.1  36.0 - 46.0 % Final   MCV 05/04/2019 91.5  80.0 - 100.0 fL Final   MCH 05/04/2019 30.4  26.0 - 34.0 pg Final   MCHC 05/04/2019 33.3  30.0 - 36.0 g/dL Final   RDW 05/04/2019 13.7  11.5 - 15.5 % Final   Platelets 05/04/2019 194  150 - 400 K/uL Final   nRBC 05/04/2019 0.0  0.0 - 0.2 % Final   Neutrophils Relative % 05/04/2019 59  % Final   Neutro Abs 05/04/2019 4.6  1.7 - 7.7 K/uL Final   Lymphocytes Relative 05/04/2019 30  % Final   Lymphs Abs 05/04/2019 2.3  0.7 - 4.0 K/uL Final   Monocytes Relative 05/04/2019 7  % Final   Monocytes Absolute 05/04/2019 0.5  0.1 - 1.0 K/uL Final   Eosinophils Relative 05/04/2019 3  % Final   Eosinophils Absolute 05/04/2019 0.2  0.0 - 0.5 K/uL Final   Basophils Relative 05/04/2019 0  % Final   Basophils Absolute 05/04/2019 0.0  0.0 - 0.1 K/uL Final   Immature Granulocytes 05/04/2019 1  % Final   Abs Immature Granulocytes 05/04/2019 0.04  0.00 - 0.07 K/uL Final   Performed at [facility], [address] [place], [place] [address]    Assessment:  Angela Sparks is a 68 y.o. female with a recent history of left lower extremity superficial thrombosis.  She has a history of left lower extremity DVT and bilateral pulmonary embolism in 2003.  DVT and PE were precipitated by hormone replacement therapy.  She was treated with Coumadin x several years then switched to Plavix in 2006 or 2007.  Bilateral lower extremity duplex on 10/19/2017 no evidence of DVT.  There was acute left superficial thrombosis of the varicosities or other superficial veins.  There were abnormalities consistent with sequela of a prior venous obstructive process, with findings that appear chronic/long-standing in nature toward identified in the proximal veins.  Labs in 09/2017 revealed a normal CBC with diff.  PTT was slightly elevated.  Hypercoagulable work-up on 11/05/2017 revealed a slightly low protein S activity of 57% (63% - 140%) and an indeterminant anticardiolipin antibody of 13%.   Normal studies included:  Factor V Leiden, prothrombin gene mutation, lupus anticoagulant panel, beta2-glycoprotein antibodies, protein C total was 113%, protein C activity 155%, protein S antigen 86%, ATIII activity 89%.  Lupus anticoagulant panel was negative.  Anti-cardiolipin antibodies were negative on 10/27/2018.  Repeat hypercoagulable work up on 01/28/2018 revealed a slightly low protein S activity of 55% (63% - 140%). Normal studies included: prothrombin gene mutation,  lupus anticoagulant panel,  beta2-glycoprotein antibodies, and anticardiolipin antibodies. Protein S antigen 83%.  Labs on 04/29/2018 revealed normal anticardiolipin antibodies and beta-2 glycoprotein antibodies.  Protein S activity was 60% (63-140%) and protein S antigen was 86% (60-150%).    Protein S antigen was 87% and protein S activity was 64% on 10/27/2018.  She has a family history of thrombosis.  Her sister has protein S  deficiency.  Symptomatically, she is doing well.  She voices no concerns.  Hemoglobin is 14.0.  Platelets 194,000.  Creatinine 1.06.  Plan: 1. Review labs from 05/04/2019. 2. Superficial thrombosis of varicosities (2018)  Patient remains on Plavix. 3.   History of DVT and PE (2003)             DVT and PE precipitated by hormone replacement therapy.             Coumadin was discontinued in 2006/2007.             Review plan for long-term anticoagulation if DVT recurs. 4.   RTC prn.  I discussed the assessment and treatment plan with the patient.  The patient was provided an opportunity to ask questions and all were answered.  The patient agreed with the plan and demonstrated an understanding of the instructions.  The patient was advised to call back or seek an in person evaluation if the symptoms worsen or if the condition fails to improve as anticipated.  I provided 9 minutes (4:07 PM - 4:15 PM) of face-to-face video visit time during this this encounter and > 50% was spent counseling as documented under my assessment and plan.  I provided these services from the [facility] office.   Nolon Stalls, MD, PhD  05/06/2019, 4:07 PM  I, Molly Dorshimer, am acting as Education administrator for [employer]. Mike Gip, MD, PhD.  I, Chenoah Mcnally C. Mike Gip, MD, have reviewed the above documentation for accuracy and completeness, and I agree with the above.

## 2019-05-06 ENCOUNTER — Inpatient Hospital Stay (HOSPITAL_BASED_OUTPATIENT_CLINIC_OR_DEPARTMENT_OTHER): Payer: Medicare Other | Admitting: Hematology and Oncology

## 2019-05-06 ENCOUNTER — Encounter: Payer: Self-pay | Admitting: Hematology and Oncology

## 2019-05-06 DIAGNOSIS — Z86718 Personal history of other venous thrombosis and embolism: Secondary | ICD-10-CM | POA: Diagnosis not present

## 2019-05-06 DIAGNOSIS — I82812 Embolism and thrombosis of superficial veins of left lower extremities: Secondary | ICD-10-CM

## 2019-05-06 DIAGNOSIS — Z7901 Long term (current) use of anticoagulants: Secondary | ICD-10-CM | POA: Diagnosis not present

## 2019-05-06 NOTE — Progress Notes (Signed)
Confirmed DOB, Address, and Name. Patient complains of bilateral feet burning sensation. States this is chronic. Denies any other concerns.

## 2019-05-07 ENCOUNTER — Other Ambulatory Visit: Payer: Medicare Other

## 2019-05-07 ENCOUNTER — Ambulatory Visit: Payer: Medicare Other | Admitting: Hematology and Oncology

## 2019-07-30 ENCOUNTER — Other Ambulatory Visit: Payer: Self-pay | Admitting: Family Medicine

## 2019-07-30 DIAGNOSIS — Z1231 Encounter for screening mammogram for malignant neoplasm of breast: Secondary | ICD-10-CM

## 2019-08-11 ENCOUNTER — Ambulatory Visit
Admission: RE | Admit: 2019-08-11 | Discharge: 2019-08-11 | Disposition: A | Discharge status: Home / Self Care | Payer: Medicare Other | Source: Ambulatory Visit | Attending: Family Medicine | Admitting: Family Medicine

## 2019-08-11 ENCOUNTER — Other Ambulatory Visit: Payer: Self-pay

## 2019-08-11 DIAGNOSIS — Z1231 Encounter for screening mammogram for malignant neoplasm of breast: Secondary | ICD-10-CM | POA: Diagnosis not present

## 2020-06-30 DIAGNOSIS — E782 Mixed hyperlipidemia: Secondary | ICD-10-CM | POA: Diagnosis not present

## 2020-06-30 DIAGNOSIS — Z86718 Personal history of other venous thrombosis and embolism: Secondary | ICD-10-CM | POA: Diagnosis not present

## 2020-06-30 DIAGNOSIS — I10 Essential (primary) hypertension: Secondary | ICD-10-CM | POA: Diagnosis not present

## 2020-08-15 ENCOUNTER — Telehealth: Payer: Self-pay | Admitting: Family Medicine

## 2020-08-15 NOTE — Telephone Encounter (Signed)
This pt has not been seen since 2018- new patient

## 2020-08-15 NOTE — Telephone Encounter (Signed)
Patient would like a referral for a mammo.  Please call patient to let her know when the orders have been sent.

## 2020-08-24 ENCOUNTER — Telehealth: Payer: Self-pay | Admitting: Family Medicine

## 2020-08-24 NOTE — Telephone Encounter (Signed)
It is probably because she hasn't been seen since 2018- she wanted a mammogram order to be put in on 9/21. Estill Bamberg probably called her and told her she would be a new patient first. Please call her and tell her we are sorry, but she is a new patient in the clinic due to not being seen since 2018. I am extraordinarily busy this am and you are trying to help me. If this is not the reason for the call, let me know

## 2020-08-24 NOTE — Telephone Encounter (Signed)
Pt called back in to make Angela Sparks aware that her spouse seen provider and was told to call in and schedule. Pt says that she's not sure if an apt is needed or just orders. Pt says that she usually has her mammogram around Sept, pt says that she was told Nov. Pt feels that that is to far out considering that she has had concerns in the past.      CB: [phone]

## 2020-08-24 NOTE — Telephone Encounter (Unsigned)
Copied from [place] [phone]. Topic: Quick Communication - See Telephone Encounter >> Aug 24, 2020 10:11 AM Loma [place] wrote: CRM for notification. See Telephone encounter for: 08/24/20.Pt has called wanting to speak to [place]. Pt would not share the reason, she just said to have Baxter Flattery call her on cell [phone]

## 2020-08-25 NOTE — Telephone Encounter (Signed)
Please call.

## 2020-08-25 NOTE — Telephone Encounter (Signed)
Scheduled patient for Novemeber new patient appt. Told her she cannot have a mammogram ordered without coming in to re-establish care as a new patient.  She verbalized understanding and wrote down appt time.   CM

## 2020-08-25 NOTE — Telephone Encounter (Signed)
noted 

## 2020-10-05 ENCOUNTER — Encounter: Payer: Self-pay | Admitting: Family Medicine

## 2020-10-05 ENCOUNTER — Other Ambulatory Visit: Payer: Self-pay

## 2020-10-05 ENCOUNTER — Ambulatory Visit: Payer: Medicare PPO | Admitting: Family Medicine

## 2020-10-05 VITALS — BP 130/80 | HR 84 | Ht 66.0 in | Wt 172.0 lb

## 2020-10-05 DIAGNOSIS — Z1231 Encounter for screening mammogram for malignant neoplasm of breast: Secondary | ICD-10-CM

## 2020-10-05 DIAGNOSIS — D1809 Hemangioma of other sites: Secondary | ICD-10-CM

## 2020-10-05 DIAGNOSIS — R234 Changes in skin texture: Secondary | ICD-10-CM | POA: Diagnosis not present

## 2020-10-05 DIAGNOSIS — H6122 Impacted cerumen, left ear: Secondary | ICD-10-CM

## 2020-10-05 DIAGNOSIS — R21 Rash and other nonspecific skin eruption: Secondary | ICD-10-CM

## 2020-10-05 DIAGNOSIS — Z7689 Persons encountering health services in other specified circumstances: Secondary | ICD-10-CM | POA: Diagnosis not present

## 2020-10-05 MED ORDER — TRIAMCINOLONE ACETONIDE 0.1 % EX CREA: 1.0000 "application " | TOPICAL_CREAM | Freq: Two times a day (BID) | CUTANEOUS | 0 refills | Status: DC

## 2020-10-05 NOTE — Progress Notes (Signed)
Date:  10/05/2020   Name:  Angela Sparks   DOB:  May 16, 1951   MRN:  [id number]   Chief Complaint: breast cancer screening (sharp pain in L) breast- x 2 weeks ago and has a rash on L) breast), Establish Care, and Ear Fullness (L) ear pain)  Patient is a 69 year old female who presents for a breast exam exam. The patient reports the following problems: mastalgia. Health maintenance has been reviewed mammogram  Ear Fullness  There is pain in the left ear. This is a new problem. The current episode started 1 to 4 weeks ago. There has been no fever. The patient is experiencing no pain (fullness). Pertinent negatives include no abdominal pain, coughing, diarrhea, ear discharge, headaches, hearing loss, neck pain, rash, rhinorrhea, sore throat or vomiting. She has tried nothing for the symptoms. The treatment provided moderate relief.  Rash This is a chronic problem. The current episode started more than 1 month ago. The problem has been waxing and waning since onset. The affected locations include the chest. The rash is characterized by redness and itchiness. Pertinent negatives include no congestion, cough, diarrhea, eye pain, fatigue, fever, rhinorrhea, shortness of breath, sore throat or vomiting.    Lab Results  Component Value Date   CREATININE 1.06 (H) 05/04/2019   BUN 16 05/04/2019   NA 140 05/04/2019   K 4.3 05/04/2019   CL 103 05/04/2019   CO2 26 05/04/2019   No results found for: CHOL, HDL, LDLCALC, LDLDIRECT, TRIG, CHOLHDL No results found for: TSH No results found for: HGBA1C Lab Results  Component Value Date   WBC 7.7 05/04/2019   HGB 14.0 05/04/2019   HCT 42.1 05/04/2019   MCV 91.5 05/04/2019   PLT 194 05/04/2019   Lab Results  Component Value Date   ALT 26 05/04/2019   AST 25 05/04/2019   ALKPHOS 55 05/04/2019   BILITOT 1.1 05/04/2019     Review of Systems  Constitutional: Negative.  Negative for chills, fatigue, fever and unexpected weight change.  HENT:  Negative for congestion, ear discharge, ear pain, hearing loss, rhinorrhea, sinus pressure, sneezing and sore throat.   Eyes: Negative for photophobia, pain, discharge, redness and itching.  Respiratory: Negative for cough, shortness of breath, wheezing and stridor.   Gastrointestinal: Negative for abdominal pain, blood in stool, constipation, diarrhea, nausea and vomiting.  Endocrine: Negative for cold intolerance, heat intolerance, polydipsia, polyphagia and polyuria.  Genitourinary: Negative for dysuria, flank pain, frequency, hematuria, menstrual problem, pelvic pain, urgency, vaginal bleeding and vaginal discharge.  Musculoskeletal: Negative for arthralgias, back pain, myalgias and neck pain.  Skin: Negative for rash.  Allergic/Immunologic: Negative for environmental allergies and food allergies.  Neurological: Negative for dizziness, weakness, light-headedness, numbness and headaches.  Hematological: Negative for adenopathy. Does not bruise/bleed easily.  Psychiatric/Behavioral: Negative for dysphoric mood. The patient is not nervous/anxious.     Patient Active Problem List   Diagnosis Date Noted  . Personal history of venous thrombosis and embolism 11/05/2017  . Superficial thrombosis of left lower extremity 11/05/2017  . Deep vein thrombosis (DVT) ([place]) 06/29/2014  . HLD (hyperlipidemia) 06/29/2014  . BP (high blood pressure) 06/29/2014    Allergies  Allergen Reactions  . Codeine Other (See Comments)    Past Surgical History:  Procedure Laterality Date  . BREAST BIOPSY Right 10/28/12   [place]  bx-neg  . BREAST BIOPSY Right 10/15/13   [place] bx/clip-neg  . COLONOSCOPY  2007  . DOPPLER ECHOCARDIOGRAPHY  07/07/2009  .  NM MYOVIEW LTD  07/07/2009  . TRANSTHORACIC ECHOCARDIOGRAM  07/07/2009    Social History   Tobacco Use  . Smoking status: Never Smoker  . Smokeless tobacco: Never Used  Substance Use Topics  . Alcohol use: No    Alcohol/week: 0.0 standard drinks  . Drug use: No       Medication list has been reviewed and updated.  Current Meds  Medication Sig  . ascorbic acid (VITAMIN C) 500 MG tablet Take 500 mg by mouth daily.  Marland Kitchen aspirin EC 81 MG tablet Take 1 tablet by mouth daily.  . clopidogrel (PLAVIX) 75 MG tablet Take 1 tablet (75 mg total) by mouth daily. NO MORE REFILLS W/O APPT (Patient taking differently: Take 75 mg by mouth daily. Dr Ubaldo Glassing)  . Cyanocobalamin (VITAMIN B-12 PO) Take 1 tablet by mouth daily.  . ergocalciferol (VITAMIN D2) 1.25 MG (50000 UT) capsule Take 1,200 Units by mouth daily.  Marland Kitchen esomeprazole (NEXIUM) 20 MG capsule Take 1 capsule by mouth 2 (two) times daily. otc  . losartan (COZAAR) 50 MG tablet Take 1 tablet (50 mg total) by mouth daily. NO MORE REFILLS W/O APPT (Patient taking differently: Take 50 mg by mouth daily. Dr Ubaldo Glassing)  . Magnesium Oxide 250 MG TABS Take 250 mg by mouth daily.   . metoprolol succinate (TOPROL XL) 25 MG 24 hr tablet Take 1 tablet (25 mg total) by mouth daily. NO MORE REFILLS W/O APPT (Patient taking differently: Take 25 mg by mouth daily. Fath)  . Quercetin 250 MG TABS Take 1 tablet by mouth daily.  . Zinc 50 MG TABS Take 1 tablet by mouth daily.    PHQ 2/9 Scores 10/05/2020 10/28/2017 10/28/2017 04/02/2016  PHQ - 2 Score 0 0 0 0  PHQ- 9 Score 0 1 - -    GAD 7 : Generalized Anxiety Score 10/05/2020  Nervous, Anxious, on Edge 0  Control/stop worrying 0  Worry too much - different things 0  Trouble relaxing 0  Restless 0  Easily annoyed or irritable 0  Afraid - awful might happen 0  Total GAD 7 Score 0    BP Readings from Last 3 Encounters:  10/05/20 130/80  11/04/18 (!) 154/71  05/06/18 134/78    Physical Exam Vitals and nursing note reviewed.  Constitutional:      General: She is not in acute distress.    Appearance: She is not diaphoretic.  HENT:     Head: Normocephalic and atraumatic.     Right Ear: Tympanic membrane, ear canal and external ear normal.     Left Ear: Tympanic membrane, ear  canal and external ear normal.     Nose: Nose normal. No congestion or rhinorrhea.  Eyes:     General:        Right eye: No discharge.        Left eye: No discharge.     Conjunctiva/sclera: Conjunctivae normal.     Pupils: Pupils are equal, round, and reactive to light.  Neck:     Thyroid: No thyromegaly.     Vascular: No JVD.  Cardiovascular:     Rate and Rhythm: Normal rate and regular rhythm.     Heart sounds: Normal heart sounds. No murmur heard.  No friction rub. No gallop.   Pulmonary:     Effort: Pulmonary effort is normal.     Breath sounds: Normal breath sounds. No wheezing or rhonchi.  Chest:     Breasts:  Right: No swelling, bleeding, inverted nipple, mass, nipple discharge, skin change or tenderness.        Left: Skin change present. No swelling, bleeding, inverted nipple, mass, nipple discharge or tenderness.  Abdominal:     General: Bowel sounds are normal.     Palpations: Abdomen is soft. There is no mass.     Tenderness: There is no abdominal tenderness. There is no guarding.  Musculoskeletal:        General: Normal range of motion.     Cervical back: Normal range of motion and neck supple.  Lymphadenopathy:     Cervical: No cervical adenopathy.  Skin:    General: Skin is warm and dry.  Neurological:     Mental Status: She is alert.     Deep Tendon Reflexes: Reflexes are normal and symmetric.     Wt Readings from Last 3 Encounters:  10/05/20 172 lb (78 kg)  11/04/18 174 lb 13.2 oz (79.3 kg)  05/06/18 173 lb 1 oz (78.5 kg)    BP 130/80   Pulse 84   Ht 5\' 6"  (1.676 m)   Wt 172 lb (78 kg)   BMI 27.76 kg/m   Assessment and Plan: 1. Establishing care with new doctor, encounter for Patient's chart was reviewed today.  Patient encounter from 2018 December was reviewed.  Patient is reestablishing after almost 3 years.  2. Rash New onset.  On the chest and particularly surrounding the left nipple.  We will refer to dermatology for evaluation in  the meantime we will use some triamcinolone to see if it may resolve with that. - Ambulatory referral to Dermatology - triamcinolone cream (KENALOG) 0.1 %; Apply 1 application topically 2 (two) times daily.  Dispense: 30 g; Refill: 0  3. Hemangioma of other sites Chronic.  Controlled.  Stable.  Patient has hemangioma that she would also like to have looked at by the dermatologist. - Ambulatory referral to Dermatology  4. Impacted cerumen of left ear Chronic.  Episodic.  Presently impacted in the left ear.  Will refer to ENT for removal. - Ambulatory referral to ENT  5. Breast cancer screening by mammogram Patient desires mammogram screening for breast cancer. - MM DIAG BREAST TOMO BILATERAL; Future  6. Breast skin changes Skin changes noted around the nipple which will refer to dermatology for evaluation and proceed with mammogram and possible ultrasound eval. - MM DIAG BREAST TOMO BILATERAL; Future - US BREAST LTD UNI LEFT INC AXILLA; Future

## 2020-10-18 ENCOUNTER — Ambulatory Visit
Admission: RE | Admit: 2020-10-18 | Discharge: 2020-10-18 | Disposition: A | Discharge status: Home / Self Care | Payer: Medicare PPO | Source: Ambulatory Visit | Attending: Family Medicine | Admitting: Family Medicine

## 2020-10-18 ENCOUNTER — Other Ambulatory Visit: Payer: Self-pay

## 2020-10-18 DIAGNOSIS — N6489 Other specified disorders of breast: Secondary | ICD-10-CM | POA: Diagnosis not present

## 2020-10-18 DIAGNOSIS — Z1231 Encounter for screening mammogram for malignant neoplasm of breast: Secondary | ICD-10-CM

## 2020-10-18 DIAGNOSIS — N6459 Other signs and symptoms in breast: Secondary | ICD-10-CM | POA: Insufficient documentation

## 2020-10-18 DIAGNOSIS — R234 Changes in skin texture: Secondary | ICD-10-CM

## 2020-10-18 DIAGNOSIS — Z1239 Encounter for other screening for malignant neoplasm of breast: Secondary | ICD-10-CM | POA: Insufficient documentation

## 2020-10-18 DIAGNOSIS — R922 Inconclusive mammogram: Secondary | ICD-10-CM | POA: Diagnosis not present

## 2020-10-23 DIAGNOSIS — R490 Dysphonia: Secondary | ICD-10-CM | POA: Diagnosis not present

## 2020-10-23 DIAGNOSIS — H6123 Impacted cerumen, bilateral: Secondary | ICD-10-CM | POA: Diagnosis not present

## 2020-10-23 DIAGNOSIS — L300 Nummular dermatitis: Secondary | ICD-10-CM | POA: Diagnosis not present

## 2020-10-23 DIAGNOSIS — F458 Other somatoform disorders: Secondary | ICD-10-CM | POA: Diagnosis not present

## 2020-10-23 DIAGNOSIS — L570 Actinic keratosis: Secondary | ICD-10-CM | POA: Diagnosis not present

## 2020-11-21 ENCOUNTER — Telehealth: Payer: Self-pay | Admitting: Family Medicine

## 2020-11-21 NOTE — Telephone Encounter (Signed)
MAILBOX FULL. Unable to leave message. Pt due to schedule Medicare Annual Wellness Visit (AWV) either virtually or in office. Whichever the patients preference is.  No history of AWV; please schedule at anytime with [facility] Health Advisor.  This should be a 40 minute visit  AWV-I PER [place] AS OF 09/25/18

## 2021-01-03 DIAGNOSIS — R21 Rash and other nonspecific skin eruption: Secondary | ICD-10-CM | POA: Diagnosis not present

## 2021-01-03 DIAGNOSIS — B359 Dermatophytosis, unspecified: Secondary | ICD-10-CM | POA: Diagnosis not present

## 2021-01-03 DIAGNOSIS — B354 Tinea corporis: Secondary | ICD-10-CM | POA: Diagnosis not present

## 2021-03-14 DIAGNOSIS — Z872 Personal history of diseases of the skin and subcutaneous tissue: Secondary | ICD-10-CM | POA: Diagnosis not present

## 2021-03-14 DIAGNOSIS — B354 Tinea corporis: Secondary | ICD-10-CM | POA: Diagnosis not present

## 2021-03-14 DIAGNOSIS — L821 Other seborrheic keratosis: Secondary | ICD-10-CM | POA: Diagnosis not present

## 2021-03-14 DIAGNOSIS — D1801 Hemangioma of skin and subcutaneous tissue: Secondary | ICD-10-CM | POA: Diagnosis not present

## 2021-03-14 DIAGNOSIS — L578 Other skin changes due to chronic exposure to nonionizing radiation: Secondary | ICD-10-CM | POA: Diagnosis not present

## 2021-06-18 ENCOUNTER — Other Ambulatory Visit: Payer: Self-pay

## 2021-06-18 ENCOUNTER — Ambulatory Visit (INDEPENDENT_AMBULATORY_CARE_PROVIDER_SITE_OTHER): Payer: Medicare PPO

## 2021-06-18 VITALS — BP 140/82 | HR 71 | Temp 98.0°F | Resp 16 | Ht 66.0 in | Wt 176.8 lb

## 2021-06-18 DIAGNOSIS — Z23 Encounter for immunization: Secondary | ICD-10-CM | POA: Diagnosis not present

## 2021-06-18 DIAGNOSIS — Z0000 Encounter for general adult medical examination without abnormal findings: Secondary | ICD-10-CM | POA: Diagnosis not present

## 2021-06-18 DIAGNOSIS — Z780 Asymptomatic menopausal state: Secondary | ICD-10-CM

## 2021-06-18 NOTE — Progress Notes (Signed)
Subjective:   Angela Sparks is a 70 y.o. female who presents for an Initial Medicare Annual Wellness Visit.  Review of Systems     Cardiac Risk Factors include: advanced age (>59mn, >>17women);hypertension     Objective:    Today's Vitals   06/18/21 1446  BP: 140/82  Pulse: 71  Resp: 16  Temp: 98 F (36.7 C)  TempSrc: Oral  SpO2: 95%  Weight: 176 lb 12.8 oz (80.2 kg)  Height: '5\' 6"'$  (1.676 m)   Body mass index is 28.54 kg/m.  Advanced Directives 06/18/2021 05/06/2019 11/04/2018 05/06/2018 02/04/2018 11/26/2017  Does Patient Have a Medical Advance Directive? No No No No No No  Would patient like information on creating a medical advance directive? No - Patient declined - No - Patient declined No - Patient declined - -    Current Medications (verified) Outpatient Encounter Medications as of 06/18/2021  Medication Sig   ascorbic acid (VITAMIN C) 500 MG tablet Take 500 mg by mouth daily.   aspirin EC 81 MG tablet Take 1 tablet by mouth daily.   Cholecalciferol (VITAMIN D3) 50 MCG (2000 UT) CAPS Take by mouth.   clopidogrel (PLAVIX) 75 MG tablet Take 1 tablet (75 mg total) by mouth daily. NO MORE REFILLS W/O APPT (Patient taking differently: Take 75 mg by mouth daily. Dr fUbaldo Glassing   Cyanocobalamin (VITAMIN B-12 PO) Take 3,000 mcg by mouth daily.   esomeprazole (NEXIUM) 20 MG capsule Take 1 capsule by mouth 2 (two) times daily. otc   losartan (COZAAR) 50 MG tablet Take 1 tablet (50 mg total) by mouth daily. NO MORE REFILLS W/O APPT (Patient taking differently: Take 50 mg by mouth daily. Dr fUbaldo Glassing   Magnesium Oxide 250 MG TABS Take 250 mg by mouth daily.    metoprolol succinate (TOPROL XL) 25 MG 24 hr tablet Take 1 tablet (25 mg total) by mouth daily. NO MORE REFILLS W/O APPT (Patient taking differently: Take 25 mg by mouth daily. Fath)   Quercetin 250 MG TABS Take 1 tablet by mouth daily.   Zinc 50 MG TABS Take 1 tablet by mouth daily.   [DISCONTINUED] losartan (COZAAR) 50 MG tablet  Take 1 tablet by mouth daily.   [DISCONTINUED] ergocalciferol (VITAMIN D2) 1.25 MG (50000 UT) capsule Take 1,200 Units by mouth daily.   [DISCONTINUED] triamcinolone cream (KENALOG) 0.1 % Apply 1 application topically 2 (two) times daily.   No facility-administered encounter medications on file as of 06/18/2021.    Allergies (verified) Codeine   History: Past Medical History:  Diagnosis Date   Allergy    DVT (deep venous thrombosis) (HCC)    GERD (gastroesophageal reflux disease)    I have had acid reflux for many years now.   Heart murmur    Was diagnosed when I was a child. But was told I outgrew it.   Hyperlipidemia    Hypertension    Multiple thyroid nodules    PE (pulmonary embolism)    Past Surgical History:  Procedure Laterality Date   BREAST BIOPSY Right 10/28/2012   [place] bx-neg   BREAST BIOPSY Right 10/15/2013   [place]/clip-neg   COLONOSCOPY  2007   DOPPLER ECHOCARDIOGRAPHY  07/07/2009   NM MYOVIEW LTD  07/07/2009   TRANSTHORACIC ECHOCARDIOGRAM  07/07/2009   Family History  Problem Relation Age of Onset   Diabetes Mother    Thyroid disease Mother    Stroke Mother    CAD Father    Breast cancer Neg  Hx    Social History   Socioeconomic History   Marital status: Married    Spouse name: Not on file   Number of children: 3   Years of education: Not on file   Highest education level: Not on file  Occupational History   Not on file  Tobacco Use   Smoking status: Never   Smokeless tobacco: Never   Tobacco comments:    I do not and never have smoked.  Vaping Use   Vaping Use: Never used  Substance and Sexual Activity   Alcohol use: Never   Drug use: Never   Sexual activity: Not on file  Other Topics Concern   Not on file  Social History Narrative   Not on file   Social Determinants of Health   Financial Resource Strain: Low Risk    Difficulty of Paying Living Expenses: Not hard at all  Food Insecurity: No Food Insecurity   Worried About Paediatric nurse in the Last Year: Never true   [place] in the Last Year: Never true  Transportation Needs: No Transportation Needs   Lack of Transportation (Medical): No   Lack of Transportation (Non-Medical): No  Physical Activity: Insufficiently Active   Days of Exercise per Week: 1 day   Minutes of Exercise per Session: 60 min  Stress: No Stress Concern Present   Feeling of Stress : Not at all  Social Connections: Socially Integrated   Frequency of Communication with Friends and Family: More than three times a week   Frequency of Social Gatherings with Friends and Family: More than three times a week   Attends Religious Services: More than 4 times per year   Active Member of [employer] or Organizations: Yes   Attends Music therapist: More than 4 times per year   Marital Status: Married    Tobacco Counseling Counseling given: Not Answered Tobacco comments: I do not and never have smoked.   Clinical Intake:  Pre-visit preparation completed: Yes  Pain : No/denies pain     BMI - recorded: 28.54 Nutritional Status: BMI 25 -29 Overweight Nutritional Risks: None Diabetes: No  How often do you need to have someone help you when you read instructions, pamphlets, or other written materials from your doctor or pharmacy?: 1 - Never    Interpreter Needed?: No  Information entered by :: Clemetine Marker LPN   Activities of Daily Living In your present state of health, do you have any difficulty performing the following activities: 06/18/2021  Hearing? N  Comment declines hearing aids  Vision? Y  Difficulty concentrating or making decisions? N  Walking or climbing stairs? N  Dressing or bathing? N  Doing errands, shopping? N  Preparing Food and eating ? N  Using the Toilet? N  In the past six months, have you accidently leaked urine? N  Do you have problems with loss of bowel control? N  Managing your Medications? N  Managing your Finances? N  Housekeeping or  managing your Housekeeping? N  Some recent data might be hidden    Patient Care Team: Juline Patch, MD as PCP - General (Family Medicine)  Indicate any recent Medical Services you may have received from other than [facility] providers in the past year (date may be approximate).     Assessment:   This is a routine wellness examination for Angela Sparks.  Hearing/Vision screen Hearing Screening - Comments:: Pt denies hearing difficulty Vision Screening - Comments:: Past due for  eye exam; needs to establish care with new provider; plans to contact insurance for in network provider  Dietary issues and exercise activities discussed: Current Exercise Habits: Structured exercise class, Type of exercise: yoga, Time (Minutes): 60, Frequency (Times/Week): 1, Weekly Exercise (Minutes/Week): 60, Intensity: Mild, Exercise limited by: None identified   Goals Addressed   None    Depression Screen PHQ 2/9 Scores 06/18/2021 10/05/2020 10/28/2017 10/28/2017 04/02/2016  PHQ - 2 Score 0 0 0 0 0  PHQ- 9 Score - 0 1 - -    Fall Risk Fall Risk  06/18/2021 10/05/2020 10/28/2017 04/02/2016  Falls in the past year? 0 0 No No  Number falls in past yr: 0 - - -  Injury with Fall? 0 - - -  Risk for fall due to : No Fall Risks - - -  Follow up - Falls evaluation completed - -    FALL RISK PREVENTION PERTAINING TO THE HOME:  Any stairs in or around the home? No  If so, are there any without handrails? No  Home free of loose throw rugs in walkways, pet beds, electrical cords, etc? Yes  Adequate lighting in your home to reduce risk of falls? Yes   ASSISTIVE DEVICES UTILIZED TO PREVENT FALLS:  Life alert? No  Use of a cane, walker or w/c? No  Grab bars in the bathroom? No  Shower chair or bench in shower? No  Elevated toilet seat or a handicapped toilet? No   TIMED UP AND GO:  Was the test performed? Yes .  Length of time to ambulate 10 feet: 5 sec.   Gait steady and fast without use of assistive  device  Cognitive Function:        Immunizations Immunization History  Administered Date(s) Administered   Fluad Quad(high Dose 65+) 07/31/2019   Influenza, High Dose Seasonal PF 10/04/2017   Tdap 12/01/2017    TDAP status: Up to date  Flu Vaccine status: Due, Education has been provided regarding the importance of this vaccine. Advised may receive this vaccine at local pharmacy or Health Dept. Aware to provide a copy of the vaccination record if obtained from local pharmacy or Health Dept. Verbalized acceptance and understanding.  Pneumococcal vaccine status: Completed during today's visit.  Covid-19 vaccine status: Declined, Education has been provided regarding the importance of this vaccine but patient still declined. Advised may receive this vaccine at local pharmacy or Health Dept.or vaccine clinic. Aware to provide a copy of the vaccination record if obtained from local pharmacy or Health Dept. Verbalized acceptance and understanding.  Qualifies for Shingles Vaccine? Yes   Zostavax completed No   Shingrix Completed?: No.    Education has been provided regarding the importance of this vaccine. Patient has been advised to call insurance company to determine out of pocket expense if they have not yet received this vaccine. Advised may also receive vaccine at local pharmacy or Health Dept. Verbalized acceptance and understanding.  Screening Tests Health Maintenance  Topic Date Due   Hepatitis C Screening  Never done   Zoster Vaccines- Shingrix (1 of 2) Never done   DEXA SCAN  Never done   PNA vac Low Risk Adult (1 of 2 - PCV13) Never done   COVID-19 Vaccine (1) 07/04/2021 (Originally 12/15/1955)   INFLUENZA VACCINE  06/25/2021   MAMMOGRAM  10/18/2022   TETANUS/TDAP  12/02/2027   HPV VACCINES  Aged Out   COLONOSCOPY (Pts 45-84yr Insurance coverage will need to be confirmed)  Discontinued  Health Maintenance  Health Maintenance Due  Topic Date Due   Hepatitis C  Screening  Never done   Zoster Vaccines- Shingrix (1 of 2) Never done   DEXA SCAN  Never done   PNA vac Low Risk Adult (1 of 2 - PCV13) Never done    Colorectal cancer screening: Type of screening: Colonoscopy. Completed 2007. Repeat every 10 years. Pt declined repeat screening colonoscopy.   Mammogram status: Completed 10/18/20. Repeat every year  Bone Density status: Ordered today. Pt provided with contact info and advised to call to schedule appt.  Lung Cancer Screening: (Low Dose CT Chest recommended if Age 8-80 years, 30 pack-year currently smoking OR have quit w/in 15years.) does not qualify.   Additional Screening:  Hepatitis C Screening: does qualify; postponed  Vision Screening: Recommended annual ophthalmology exams for early detection of glaucoma and other disorders of the eye. Is the patient up to date with their annual eye exam?  No  Who is the provider or what is the name of the office in which the patient attends annual eye exams? Not established If pt is not established with a provider, would they like to be referred to a provider to establish care? No .   Dental Screening: Recommended annual dental exams for proper oral hygiene  Community Resource Referral / Chronic Care Management: CRR required this visit?  No   CCM required this visit?  No      Plan:     I have personally reviewed and noted the following in the patient's chart:   Medical and social history Use of alcohol, tobacco or illicit drugs  Current medications and supplements including opioid prescriptions. Patient is not currently taking opioid prescriptions. Functional ability and status Nutritional status Physical activity Advanced directives List of other physicians Hospitalizations, surgeries, and ER visits in previous 12 months Vitals Screenings to include cognitive, depression, and falls Referrals and appointments  In addition, I have reviewed and discussed with patient certain  preventive protocols, quality metrics, and best practice recommendations. A written personalized care plan for preventive services as well as general preventive health recommendations were provided to patient.     Clemetine Marker, LPN   [id number]   Nurse Notes: pt aware due for CPE and labs; scheduled for 06/26/21 at 8:40.

## 2021-06-18 NOTE — Patient Instructions (Addendum)
Ms. Angela Sparks , Thank you for taking time to come for your Medicare Wellness Visit. I appreciate your ongoing commitment to your health goals. Please review the following plan we discussed and let me know if I can assist you in the future.   Screening recommendations/referrals: Colonoscopy: declined Mammogram: done 10/18/20. Due 10/19/21 Bone Density: Please call [phone] to schedule your bone density screening.  Recommended yearly ophthalmology/optometry visit for glaucoma screening and checkup Recommended yearly dental visit for hygiene and checkup  Vaccinations: Influenza vaccine: due 07/26/21 Pneumococcal vaccine: done today  Tdap vaccine: done 12/01/17 Shingles vaccine: Shingrix discussed. Please contact your pharmacy for coverage information.  Covid-19:declined  Advanced directives: Please bring a copy of your health care power of attorney and living will to the office at your convenience.   Conditions/risks identified: Recommend increasing physical activity as tolerated.   Next appointment: Follow up in one year for your annual wellness visit    Preventive Care 65 Years and Older, Female Preventive care refers to lifestyle choices and visits with your health care provider that can promote health and wellness. What does preventive care include? A yearly physical exam. This is also called an annual well check. Dental exams once or twice a year. Routine eye exams. Ask your health care provider how often you should have your eyes checked. Personal lifestyle choices, including: Daily care of your teeth and gums. Regular physical activity. Eating a healthy diet. Avoiding tobacco and drug use. Limiting alcohol use. Practicing safe sex. Taking low-dose aspirin every day. Taking vitamin and mineral supplements as recommended by your health care provider. What happens during an annual well check? The services and screenings done by your health care provider during your annual well  check will depend on your age, overall health, lifestyle risk factors, and family history of disease. Counseling  Your health care provider may ask you questions about your: Alcohol use. Tobacco use. Drug use. Emotional well-being. Home and relationship well-being. Sexual activity. Eating habits. History of falls. Memory and ability to understand (cognition). Work and work Statistician. Reproductive health. Screening  You may have the following tests or measurements: Height, weight, and BMI. Blood pressure. Lipid and cholesterol levels. These may be checked every 5 years, or more frequently if you are over 86 years old. Skin check. Lung cancer screening. You may have this screening every year starting at age 11 if you have a 30-pack-year history of smoking and currently smoke or have quit within the past 15 years. Fecal occult blood test (FOBT) of the stool. You may have this test every year starting at age 31. Flexible sigmoidoscopy or colonoscopy. You may have a sigmoidoscopy every 5 years or a colonoscopy every 10 years starting at age 11. Hepatitis C blood test. Hepatitis B blood test. Sexually transmitted disease (STD) testing. Diabetes screening. This is done by checking your blood sugar (glucose) after you have not eaten for a while (fasting). You may have this done every 1-3 years. Bone density scan. This is done to screen for osteoporosis. You may have this done starting at age 62. Mammogram. This may be done every 1-2 years. Talk to your health care provider about how often you should have regular mammograms. Talk with your health care provider about your test results, treatment options, and if necessary, the need for more tests. Vaccines  Your health care provider may recommend certain vaccines, such as: Influenza vaccine. This is recommended every year. Tetanus, diphtheria, and acellular pertussis (Tdap, Td) vaccine. You may need a  Td booster every 10 years. Zoster  vaccine. You may need this after age 59. Pneumococcal 13-valent conjugate (PCV13) vaccine. One dose is recommended after age 56. Pneumococcal polysaccharide (PPSV23) vaccine. One dose is recommended after age 49. Talk to your health care provider about which screenings and vaccines you need and how often you need them. This information is not intended to replace advice given to you by your health care provider. Make sure you discuss any questions you have with your health care provider. Document Released: 12/08/2015 Document Revised: 07/31/2016 Document Reviewed: 09/12/2015 Elsevier Interactive Patient Education  2017 [place] Prevention in the Home Falls can cause injuries. They can happen to people of all ages. There are many things you can do to make your home safe and to help prevent falls. What can I do on the outside of my home? Regularly fix the edges of walkways and driveways and fix any cracks. Remove anything that might make you trip as you walk through a door, such as a raised step or threshold. Trim any bushes or trees on the path to your home. Use bright outdoor lighting. Clear any walking paths of anything that might make someone trip, such as rocks or tools. Regularly check to see if handrails are loose or broken. Make sure that both sides of any steps have handrails. Any raised decks and porches should have guardrails on the edges. Have any leaves, snow, or ice cleared regularly. Use sand or salt on walking paths during winter. Clean up any spills in your garage right away. This includes oil or grease spills. What can I do in the bathroom? Use night lights. Install grab bars by the toilet and in the tub and shower. Do not use towel bars as grab bars. Use non-skid mats or decals in the tub or shower. If you need to sit down in the shower, use a plastic, non-slip stool. Keep the floor dry. Clean up any water that spills on the floor as soon as it happens. Remove  soap buildup in the tub or shower regularly. Attach bath mats securely with double-sided non-slip rug tape. Do not have throw rugs and other things on the floor that can make you trip. What can I do in the bedroom? Use night lights. Make sure that you have a light by your bed that is easy to reach. Do not use any sheets or blankets that are too big for your bed. They should not hang down onto the floor. Have a firm chair that has side arms. You can use this for support while you get dressed. Do not have throw rugs and other things on the floor that can make you trip. What can I do in the kitchen? Clean up any spills right away. Avoid walking on wet floors. Keep items that you use a lot in easy-to-reach places. If you need to reach something above you, use a strong step stool that has a grab bar. Keep electrical cords out of the way. Do not use floor polish or wax that makes floors slippery. If you must use wax, use non-skid floor wax. Do not have throw rugs and other things on the floor that can make you trip. What can I do with my stairs? Do not leave any items on the stairs. Make sure that there are handrails on both sides of the stairs and use them. Fix handrails that are broken or loose. Make sure that handrails are as long as the stairways. Check any  carpeting to make sure that it is firmly attached to the stairs. Fix any carpet that is loose or worn. Avoid having throw rugs at the top or bottom of the stairs. If you do have throw rugs, attach them to the floor with carpet tape. Make sure that you have a light switch at the top of the stairs and the bottom of the stairs. If you do not have them, ask someone to add them for you. What else can I do to help prevent falls? Wear shoes that: Do not have high heels. Have rubber bottoms. Are comfortable and fit you well. Are closed at the toe. Do not wear sandals. If you use a stepladder: Make sure that it is fully opened. Do not climb a  closed stepladder. Make sure that both sides of the stepladder are locked into place. Ask someone to hold it for you, if possible. Clearly mark and make sure that you can see: Any grab bars or handrails. First and last steps. Where the edge of each step is. Use tools that help you move around (mobility aids) if they are needed. These include: Canes. Walkers. Scooters. Crutches. Turn on the lights when you go into a dark area. Replace any light bulbs as soon as they burn out. Set up your furniture so you have a clear path. Avoid moving your furniture around. If any of your floors are uneven, fix them. If there are any pets around you, be aware of where they are. Review your medicines with your doctor. Some medicines can make you feel dizzy. This can increase your chance of falling. Ask your doctor what other things that you can do to help prevent falls. This information is not intended to replace advice given to you by your health care provider. Make sure you discuss any questions you have with your health care provider. Document Released: 09/07/2009 Document Revised: 04/18/2016 Document Reviewed: 12/16/2014 Elsevier Interactive Patient Education  2017 [employer].    Pneumococcal Conjugate Vaccine (Prevnar 20) Suspension for Injection What is this medication? PNEUMOCOCCAL VACCINE (NEU mo KOK al vak SEEN) is a vaccine. It prevents pneumococcus bacterial infections. These bacteria can cause serious infections like pneumonia, meningitis, and blood infections. This vaccine will not treat an infection and will not cause infection. This vaccine is recommended foradults 18 years and older. This medicine may be used for other purposes; ask your health care provider orpharmacist if you have questions. COMMON BRAND NAME(S): Prevnar 20 What should I tell my care team before I take this medication? They need to know if you have any of these conditions: bleeding disorder fever immune system  problems an unusual or allergic reaction to pneumococcal vaccine, diphtheria toxoid, other vaccines, other medicines, foods, dyes, or preservatives pregnant or trying to get pregnant breast-feeding How should I use this medication? This vaccine is injected into a muscle. It is given by a health care provider. A copy of Vaccine Information Statements will be given before each vaccination. Be sure to read this information carefully each time. This sheet may changeoften. Talk to your health care provider about the use of this medicine in children.Special care may be needed. Overdosage: If you think you have taken too much of this medicine contact apoison control center or emergency room at once. NOTE: This medicine is only for you. Do not share this medicine with others. What if I miss a dose? This does not apply. This medicine is not for regular use. What may interact with this medication?  medicines for cancer chemotherapy medicines that suppress your immune function steroid medicines like prednisone or cortisone This list may not describe all possible interactions. Give your health care provider a list of all the medicines, herbs, non-prescription drugs, or dietary supplements you use. Also tell them if you smoke, drink alcohol, or use illegaldrugs. Some items may interact with your medicine. What should I watch for while using this medication? Mild fever and pain should go away in 3 days or less. Report any unusualsymptoms to your health care provider. What side effects may I notice from receiving this medication? Side effects that you should report to your doctor or health care professionalas soon as possible: allergic reactions (skin rash, itching or hives; swelling of the face, lips, or tongue) confusion fast, irregular heartbeat fever over 102 degrees F muscle weakness seizures trouble breathing unusual bruising or bleeding Side effects that usually do not require medical attention  (report to yourdoctor or health care professional if they continue or are bothersome): fever of 102 degrees F or less headache joint pain muscle cramps, pain pain, tender at site where injected This list may not describe all possible side effects. Call your doctor for medical advice about side effects. You may report side effects to FDA at1-800-FDA-1088. Where should I keep my medication? This vaccine is only given by a health care provider. It will not be stored athome. NOTE: This sheet is a summary. It may not cover all possible information. If you have questions about this medicine, talk to your doctor, pharmacist, orhealth care provider.  2022 Elsevier/Gold Standard (2020-07-20 16:14:35)

## 2021-06-19 ENCOUNTER — Other Ambulatory Visit: Payer: Self-pay | Admitting: Family Medicine

## 2021-06-19 DIAGNOSIS — Z1231 Encounter for screening mammogram for malignant neoplasm of breast: Secondary | ICD-10-CM

## 2021-06-26 ENCOUNTER — Encounter: Payer: Self-pay | Admitting: Family Medicine

## 2021-06-26 ENCOUNTER — Other Ambulatory Visit: Payer: Self-pay

## 2021-06-26 ENCOUNTER — Ambulatory Visit (INDEPENDENT_AMBULATORY_CARE_PROVIDER_SITE_OTHER): Payer: Medicare PPO | Admitting: Family Medicine

## 2021-06-26 VITALS — BP 138/80 | HR 64 | Ht 66.0 in | Wt 175.0 lb

## 2021-06-26 DIAGNOSIS — E785 Hyperlipidemia, unspecified: Secondary | ICD-10-CM

## 2021-06-26 DIAGNOSIS — Z8639 Personal history of other endocrine, nutritional and metabolic disease: Secondary | ICD-10-CM | POA: Diagnosis not present

## 2021-06-26 DIAGNOSIS — Z0000 Encounter for general adult medical examination without abnormal findings: Secondary | ICD-10-CM

## 2021-06-26 NOTE — Progress Notes (Signed)
Date:  06/26/2021   Name:  ALIEYAH WAHEED   DOB:  1951-04-20   MRN:  [id number]   Chief Complaint: Annual Exam (Needs breast exam)  Patient is a 70 year old female who presents for a comprehensive physical exam. The patient reports the following problems: none. Health maintenance has been reviewed mammoagram.     Lab Results  Component Value Date   CREATININE 1.06 (H) 05/04/2019   BUN 16 05/04/2019   NA 140 05/04/2019   K 4.3 05/04/2019   CL 103 05/04/2019   CO2 26 05/04/2019   No results found for: CHOL, HDL, LDLCALC, LDLDIRECT, TRIG, CHOLHDL No results found for: TSH No results found for: HGBA1C Lab Results  Component Value Date   WBC 7.7 05/04/2019   HGB 14.0 05/04/2019   HCT 42.1 05/04/2019   MCV 91.5 05/04/2019   PLT 194 05/04/2019   Lab Results  Component Value Date   ALT 26 05/04/2019   AST 25 05/04/2019   ALKPHOS 55 05/04/2019   BILITOT 1.1 05/04/2019     Review of Systems  Constitutional:  Negative for chills and fever.  HENT:  Negative for sore throat.   Respiratory:  Positive for shortness of breath.   Cardiovascular:  Negative for chest pain, palpitations and leg swelling.  Gastrointestinal:  Negative for abdominal pain, blood in stool, constipation, diarrhea and nausea.  Endocrine: Negative for polydipsia.  Genitourinary:  Negative for dysuria, frequency, hematuria and urgency.  Musculoskeletal:  Negative for back pain, myalgias and neck pain.  Skin:  Negative for rash.  Allergic/Immunologic: Negative for environmental allergies.  Neurological:  Negative for dizziness and headaches.  Hematological:  Does not bruise/bleed easily.  Psychiatric/Behavioral:  Negative for suicidal ideas. The patient is not nervous/anxious.    Patient Active Problem List   Diagnosis Date Noted  . Personal history of venous thrombosis and embolism 11/05/2017  . Superficial thrombosis of left lower extremity 11/05/2017  . Deep vein thrombosis (DVT) ([place]) 06/29/2014   . HLD (hyperlipidemia) 06/29/2014  . BP (high blood pressure) 06/29/2014    Allergies  Allergen Reactions  . Codeine Other (See Comments)    Past Surgical History:  Procedure Laterality Date  . BREAST BIOPSY Right 10/28/2012   [place]  bx-neg  . BREAST BIOPSY Right 10/15/2013   [place] bx/clip-neg  . COLONOSCOPY  2007  . DOPPLER ECHOCARDIOGRAPHY  07/07/2009  . NM MYOVIEW LTD  07/07/2009  . TRANSTHORACIC ECHOCARDIOGRAM  07/07/2009    Social History   Tobacco Use  . Smoking status: Never  . Smokeless tobacco: Never  . Tobacco comments:    I do not and never have smoked.  Vaping Use  . Vaping Use: Never used  Substance Use Topics  . Alcohol use: Never  . Drug use: Never     Medication list has been reviewed and updated.  Current Meds  Medication Sig  . ascorbic acid (VITAMIN C) 500 MG tablet Take 500 mg by mouth daily.  Marland Kitchen aspirin EC 81 MG tablet Take 1 tablet by mouth daily.  . Cholecalciferol (VITAMIN D3) 50 MCG (2000 UT) CAPS Take by mouth.  . clopidogrel (PLAVIX) 75 MG tablet Take 1 tablet (75 mg total) by mouth daily. NO MORE REFILLS W/O APPT (Patient taking differently: Take 75 mg by mouth daily. Dr Ubaldo Glassing)  . Cyanocobalamin (VITAMIN B-12 PO) Take 3,000 mcg by mouth daily.  Marland Kitchen esomeprazole (NEXIUM) 20 MG capsule Take 1 capsule by mouth 2 (two) times daily. otc  .  losartan (COZAAR) 50 MG tablet Take 1 tablet (50 mg total) by mouth daily. NO MORE REFILLS W/O APPT (Patient taking differently: Take 50 mg by mouth daily. Dr Ubaldo Glassing)  . Magnesium Oxide 250 MG TABS Take 250 mg by mouth daily.   . metoprolol succinate (TOPROL XL) 25 MG 24 hr tablet Take 1 tablet (25 mg total) by mouth daily. NO MORE REFILLS W/O APPT (Patient taking differently: Take 25 mg by mouth daily. Fath)  . Quercetin 250 MG TABS Take 1 tablet by mouth daily.  . Zinc 50 MG TABS Take 1 tablet by mouth daily.    PHQ 2/9 Scores 06/18/2021 10/05/2020 10/28/2017 10/28/2017  PHQ - 2 Score 0 0 0 0  PHQ- 9 Score - 0 1 -     GAD 7 : Generalized Anxiety Score 10/05/2020  Nervous, Anxious, on Edge 0  Control/stop worrying 0  Worry too much - different things 0  Trouble relaxing 0  Restless 0  Easily annoyed or irritable 0  Afraid - awful might happen 0  Total GAD 7 Score 0    BP Readings from Last 3 Encounters:  06/26/21 138/80  06/18/21 140/82  10/05/20 130/80    Physical Exam Vitals and nursing note reviewed.  Constitutional:      Appearance: Normal appearance. She is well-developed and normal weight.  HENT:     Head: Normocephalic.     Jaw: There is normal jaw occlusion.     Right Ear: Hearing, tympanic membrane, ear canal and external ear normal.     Left Ear: Hearing, tympanic membrane, ear canal and external ear normal.     Nose: Nose normal.     Right Turbinates: Not enlarged or swollen.     Left Turbinates: Not enlarged or swollen.     Mouth/Throat:     Lips: Pink.     Mouth: Mucous membranes are moist.     Dentition: Normal dentition.     Tongue: No lesions.     Palate: No mass.     Pharynx: Oropharynx is clear. Uvula midline. No pharyngeal swelling, oropharyngeal exudate, posterior oropharyngeal erythema or uvula swelling.     Tonsils: No tonsillar exudate or tonsillar abscesses.  Eyes:     General: Lids are normal. Vision grossly intact. Gaze aligned appropriately. No scleral icterus.       Left eye: No foreign body or hordeolum.     Conjunctiva/sclera: Conjunctivae normal.     Right eye: Right conjunctiva is not injected.     Left eye: Left conjunctiva is not injected.     Pupils: Pupils are equal, round, and reactive to light.     Funduscopic exam:    Right eye: Red reflex present.        Left eye: Red reflex present. Neck:     Thyroid: No thyroid mass, thyromegaly or thyroid tenderness.     Vascular: Normal carotid pulses. No carotid bruit, hepatojugular reflux or JVD.     Trachea: Trachea and phonation normal. No tracheal deviation.  Cardiovascular:     Rate and  Rhythm: Normal rate and regular rhythm.     Pulses: Normal pulses.          Carotid pulses are 2+ on the right side and 2+ on the left side.      Radial pulses are 2+ on the right side and 2+ on the left side.       Femoral pulses are 2+ on the right side and 2+ on the left  side.      Popliteal pulses are 2+ on the right side and 2+ on the left side.       Dorsalis pedis pulses are 2+ on the right side and 2+ on the left side.       Posterior tibial pulses are 2+ on the right side and 2+ on the left side.     Heart sounds: Normal heart sounds, S1 normal and S2 normal. No murmur heard. No systolic murmur is present.  No diastolic murmur is present.    No friction rub. No gallop. No S3 or S4 sounds.  Pulmonary:     Effort: Pulmonary effort is normal. No respiratory distress.     Breath sounds: Normal breath sounds. No decreased breath sounds, wheezing, rhonchi or rales.  Chest:  Breasts:    Right: Normal. No swelling, bleeding, inverted nipple, mass, nipple discharge, skin change, tenderness, axillary adenopathy or supraclavicular adenopathy.     Left: Normal. No swelling, bleeding, inverted nipple, mass, nipple discharge, skin change, tenderness, axillary adenopathy or supraclavicular adenopathy.  Abdominal:     General: Bowel sounds are normal.     Palpations: Abdomen is soft. There is no hepatomegaly, splenomegaly or mass.     Tenderness: There is no abdominal tenderness. There is no guarding or rebound.     Hernia: There is no hernia in the left inguinal area or right inguinal area.  Genitourinary:    Rectum: Normal. Guaiac result negative. No mass.  Musculoskeletal:        General: No tenderness. Normal range of motion.     Cervical back: Full passive range of motion without pain, normal range of motion and neck supple.     Right lower leg: No edema.     Left lower leg: No edema.  Lymphadenopathy:     Head:     Right side of head: No submental, submandibular or tonsillar  adenopathy.     Left side of head: No submental, submandibular or tonsillar adenopathy.     Cervical: No cervical adenopathy.     Right cervical: No superficial, deep or posterior cervical adenopathy.    Left cervical: No superficial, deep or posterior cervical adenopathy.     Upper Body:     Right upper body: No supraclavicular or axillary adenopathy.     Left upper body: No supraclavicular or axillary adenopathy.     Lower Body: No right inguinal adenopathy. No left inguinal adenopathy.  Skin:    General: Skin is warm.     Capillary Refill: Capillary refill takes less than 2 seconds.     Findings: No rash.  Neurological:     Mental Status: She is alert.     Cranial Nerves: Cranial nerves are intact. No cranial nerve deficit.     Sensory: Sensation is intact.     Motor: Motor function is intact.     Deep Tendon Reflexes: Reflexes normal.     Reflex Scores:      Tricep reflexes are 2+ on the right side and 2+ on the left side.      Bicep reflexes are 2+ on the right side and 2+ on the left side.      Brachioradialis reflexes are 2+ on the right side and 2+ on the left side.      Patellar reflexes are 2+ on the right side and 2+ on the left side.      Achilles reflexes are 2+ on the right side and 2+ on the left  side. Psychiatric:        Mood and Affect: Mood is not anxious or depressed.        Behavior: Behavior is cooperative.    Wt Readings from Last 3 Encounters:  06/26/21 175 lb (79.4 kg)  06/18/21 176 lb 12.8 oz (80.2 kg)  10/05/20 172 lb (78 kg)    BP 138/80   Pulse 64   Ht '5\' 6"'$  (1.676 m)   Wt 175 lb (79.4 kg)   BMI 28.25 kg/m   Assessment and Plan:  1. Annual physical exam Ayisha L Fouts is a 70 y.o. female who presents today for her Complete Annual Exam. She feels well. She reports exercising as tolerated. She reports she is sleeping fairly well.  Patient's chart was reviewed for previous encounters most recent labs as well as care everywhere.Immunizations are  reviewed and recommendations provided.   Age appropriate screening tests are discussed. Counseling given for risk factor reduction interventions.  No subjective/objective concerns noted during history and physical exam, review of systems, nor physical exam.  Laboratories were obtained including lipid panel CBC and renal function panel - Lipid Panel With LDL/HDL Ratio - CBC with Differential/Platelet - Renal Function Panel  2. Hyperlipidemia, unspecified hyperlipidemia type Patient with history of hyperlipidemia controlled with diet and we will check current status of LDL with lipid panel.  3. History of vitamin D deficiency Patient has a history of vitamin D deficiency is currently not taking any supplementation and we will recheck vitamin D 25-hydroxy. - VITAMIN D 25 Hydroxy (Vit-D Deficiency, Fractures)

## 2021-06-27 LAB — CBC WITH DIFFERENTIAL/PLATELET
Basophils Absolute: 0 10*3/uL (ref 0.0–0.2)
Basos: 1 %
EOS (ABSOLUTE): 0.2 10*3/uL (ref 0.0–0.4)
Eos: 3 %
Hematocrit: 44.6 % (ref 34.0–46.6)
Hemoglobin: 14.5 g/dL (ref 11.1–15.9)
Immature Grans (Abs): 0 10*3/uL (ref 0.0–0.1)
Immature Granulocytes: 0 %
Lymphocytes Absolute: 1.9 10*3/uL (ref 0.7–3.1)
Lymphs: 24 %
MCH: 29.4 pg (ref 26.6–33.0)
MCHC: 32.5 g/dL (ref 31.5–35.7)
MCV: 91 fL (ref 79–97)
Monocytes Absolute: 0.5 10*3/uL (ref 0.1–0.9)
Monocytes: 6 %
Neutrophils Absolute: 5.1 10*3/uL (ref 1.4–7.0)
Neutrophils: 66 %
Platelets: 213 10*3/uL (ref 150–450)
RBC: 4.93 x10E6/uL (ref 3.77–5.28)
RDW: 12.6 % (ref 11.7–15.4)
WBC: 7.7 10*3/uL (ref 3.4–10.8)

## 2021-06-27 LAB — RENAL FUNCTION PANEL
Albumin: 4.7 g/dL (ref 3.8–4.8)
BUN/Creatinine Ratio: 10 — ABNORMAL LOW (ref 12–28)
BUN: 11 mg/dL (ref 8–27)
CO2: 26 mmol/L (ref 20–29)
Calcium: 9.5 mg/dL (ref 8.7–10.3)
Chloride: 102 mmol/L (ref 96–106)
Creatinine, Ser: 1.15 mg/dL — ABNORMAL HIGH (ref 0.57–1.00)
Glucose: 132 mg/dL — ABNORMAL HIGH (ref 65–99)
Phosphorus: 2.9 mg/dL — ABNORMAL LOW (ref 3.0–4.3)
Potassium: 4.9 mmol/L (ref 3.5–5.2)
Sodium: 143 mmol/L (ref 134–144)
eGFR: 52 mL/min/{1.73_m2} — ABNORMAL LOW (ref >59)

## 2021-06-27 LAB — LIPID PANEL WITH LDL/HDL RATIO
Cholesterol, Total: 295 mg/dL — ABNORMAL HIGH (ref 100–199)
HDL: 53 mg/dL (ref >39)
LDL Chol Calc (NIH): 185 mg/dL — ABNORMAL HIGH (ref 0–99)
LDL/HDL Ratio: 3.5 ratio — ABNORMAL HIGH (ref 0.0–3.2)
Triglycerides: 294 mg/dL — ABNORMAL HIGH (ref 0–149)
VLDL Cholesterol Cal: 57 mg/dL — ABNORMAL HIGH (ref 5–40)

## 2021-06-27 LAB — VITAMIN D 25 HYDROXY (VIT D DEFICIENCY, FRACTURES): Vit D, 25-Hydroxy: 35.6 ng/mL (ref 30.0–100.0)

## 2021-06-28 ENCOUNTER — Ambulatory Visit: Payer: Medicare PPO

## 2021-06-28 DIAGNOSIS — R739 Hyperglycemia, unspecified: Secondary | ICD-10-CM

## 2021-06-29 LAB — HEMOGLOBIN A1C
Est. average glucose Bld gHb Est-mCnc: 131 mg/dL
Hgb A1c MFr Bld: 6.2 % — ABNORMAL HIGH (ref 4.8–5.6)

## 2021-07-02 ENCOUNTER — Ambulatory Visit
Admission: RE | Admit: 2021-07-02 | Discharge: 2021-07-02 | Disposition: A | Discharge status: Home / Self Care | Payer: Medicare PPO | Source: Ambulatory Visit | Attending: Family Medicine | Admitting: Family Medicine

## 2021-07-02 ENCOUNTER — Other Ambulatory Visit: Payer: Self-pay

## 2021-07-02 DIAGNOSIS — M85851 Other specified disorders of bone density and structure, right thigh: Secondary | ICD-10-CM | POA: Diagnosis not present

## 2021-07-02 DIAGNOSIS — Z780 Asymptomatic menopausal state: Secondary | ICD-10-CM | POA: Diagnosis not present

## 2021-07-06 DIAGNOSIS — E782 Mixed hyperlipidemia: Secondary | ICD-10-CM | POA: Diagnosis not present

## 2021-07-06 DIAGNOSIS — I10 Essential (primary) hypertension: Secondary | ICD-10-CM | POA: Diagnosis not present

## 2021-07-06 DIAGNOSIS — Z86718 Personal history of other venous thrombosis and embolism: Secondary | ICD-10-CM | POA: Diagnosis not present

## 2021-08-10 ENCOUNTER — Telehealth: Payer: Self-pay

## 2021-08-10 ENCOUNTER — Other Ambulatory Visit: Payer: Self-pay

## 2021-08-10 DIAGNOSIS — Z1211 Encounter for screening for malignant neoplasm of colon: Secondary | ICD-10-CM

## 2021-08-10 NOTE — Telephone Encounter (Signed)
Called pt- LM asking about the colonoscopy and FIT test

## 2021-08-10 NOTE — Progress Notes (Signed)
FIT test ordered

## 2021-08-21 DIAGNOSIS — Z1211 Encounter for screening for malignant neoplasm of colon: Secondary | ICD-10-CM | POA: Diagnosis not present

## 2021-08-24 LAB — FECAL OCCULT BLOOD, IMMUNOCHEMICAL: Fecal Occult Bld: NEGATIVE

## 2021-10-22 ENCOUNTER — Other Ambulatory Visit: Payer: Self-pay

## 2021-10-22 ENCOUNTER — Ambulatory Visit
Admission: RE | Admit: 2021-10-22 | Discharge: 2021-10-22 | Disposition: A | Discharge status: Home / Self Care | Payer: Medicare PPO | Source: Ambulatory Visit | Attending: Family Medicine | Admitting: Family Medicine

## 2021-10-22 DIAGNOSIS — Z1231 Encounter for screening mammogram for malignant neoplasm of breast: Secondary | ICD-10-CM | POA: Insufficient documentation

## 2022-06-19 ENCOUNTER — Ambulatory Visit (INDEPENDENT_AMBULATORY_CARE_PROVIDER_SITE_OTHER): Payer: Medicare PPO

## 2022-06-19 DIAGNOSIS — Z0000 Encounter for general adult medical examination without abnormal findings: Secondary | ICD-10-CM | POA: Diagnosis not present

## 2022-06-19 DIAGNOSIS — Z1231 Encounter for screening mammogram for malignant neoplasm of breast: Secondary | ICD-10-CM

## 2022-06-19 NOTE — Progress Notes (Signed)
I connected with  Angela Sparks on 06/19/22 by a audio enabled telemedicine application and verified that I am speaking with the correct person using two identifiers.  Patient Location: Home  Provider Location: Home Office  I discussed the limitations of evaluation and management by telemedicine. The patient expressed understanding and agreed to proceed.   Subjective:   Angela Sparks is a 71 y.o. female who presents for Medicare Annual (Subsequent) preventive examination.  Review of Systems    Per HPI unless specifically indicated above    Cardiac Risk Factors include: advanced age (>105mn, >>48women);female gender          Objective:    There were no vitals filed for this visit. There is no height or weight on file to calculate BMI.     06/18/2021    2:57 PM 05/06/2019   11:41 AM 11/04/2018   11:12 AM 05/06/2018   11:11 AM 02/04/2018   10:20 AM 11/26/2017   11:03 AM  Advanced Directives  Does Patient Have a Medical Advance Directive? No No No No No No  Would patient like information on creating a medical advance directive? No - Patient declined  No - Patient declined No - Patient declined      Current Medications (verified) Outpatient Encounter Medications as of 06/19/2022  Medication Sig   clopidogrel (PLAVIX) 75 MG tablet Take 1 tablet (75 mg total) by mouth daily. NO MORE REFILLS W/O APPT (Patient taking differently: Take 75 mg by mouth daily. Dr fUbaldo Glassing   esomeprazole (NEXIUM) 20 MG capsule Take 1 capsule by mouth 2 (two) times daily. otc   losartan (COZAAR) 50 MG tablet Take 1 tablet (50 mg total) by mouth daily. NO MORE REFILLS W/O APPT (Patient taking differently: Take 50 mg by mouth daily. Dr fUbaldo Glassing   metoprolol succinate (TOPROL XL) 25 MG 24 hr tablet Take 1 tablet (25 mg total) by mouth daily. NO MORE REFILLS W/O APPT (Patient taking differently: Take 25 mg by mouth daily. Fath)   ascorbic acid (VITAMIN C) 500 MG tablet Take 500 mg by mouth daily. (Patient not  taking: Reported on 06/19/2022)   aspirin EC 81 MG tablet Take 1 tablet by mouth daily. (Patient not taking: Reported on 06/19/2022)   Cholecalciferol (VITAMIN D3) 50 MCG (2000 UT) CAPS Take by mouth. (Patient not taking: Reported on 06/19/2022)   Cyanocobalamin (VITAMIN B-12 PO) Take 3,000 mcg by mouth daily. (Patient not taking: Reported on 06/19/2022)   Magnesium Oxide 250 MG TABS Take 250 mg by mouth daily.  (Patient not taking: Reported on 06/19/2022)   Quercetin 250 MG TABS Take 1 tablet by mouth daily. (Patient not taking: Reported on 06/19/2022)   Zinc 50 MG TABS Take 1 tablet by mouth daily. (Patient not taking: Reported on 06/19/2022)   No facility-administered encounter medications on file as of 06/19/2022.    Allergies (verified) Codeine   History: Past Medical History:  Diagnosis Date   Allergy    DVT (deep venous thrombosis) (HCC)    GERD (gastroesophageal reflux disease)    I have had acid reflux for many years now.   Heart murmur    Was diagnosed when I was a child. But was told I outgrew it.   Hyperlipidemia    Hypertension    Multiple thyroid nodules    PE (pulmonary embolism)    Past Surgical History:  Procedure Laterality Date   BREAST BIOPSY Right 10/28/2012   [place] bx-neg   BREAST BIOPSY Right  10/15/2013   [place] bx/clip-neg   COLONOSCOPY  2007   DOPPLER ECHOCARDIOGRAPHY  07/07/2009   NM MYOVIEW LTD  07/07/2009   TRANSTHORACIC ECHOCARDIOGRAM  07/07/2009   Family History  Problem Relation Age of Onset   Diabetes Mother    Thyroid disease Mother    Stroke Mother    CAD Father    Breast cancer Neg Hx    Social History   Socioeconomic History   Marital status: Married    Spouse name: Maniyah Moller   Number of children: 3   Years of education: Not on file   Highest education level: Not on file  Occupational History   Occupation: Retired  Tobacco Use   Smoking status: Never   Smokeless tobacco: Never   Tobacco comments:    I do not and never have smoked.   Vaping Use   Vaping Use: Never used  Substance and Sexual Activity   Alcohol use: Never   Drug use: Never   Sexual activity: Not on file  Other Topics Concern   Not on file  Social History Narrative   Not on file   Social Determinants of Health   Financial Resource Strain: Low Risk  (06/19/2022)   Overall Financial Resource Strain (CARDIA)    Difficulty of Paying Living Expenses: Not hard at all  Food Insecurity: No Food Insecurity (06/19/2022)   Hunger Vital Sign    Worried About Running Out of Food in the Last Year: Never true    [place] in the Last Year: Never true  Transportation Needs: No Transportation Needs (06/19/2022)   PRAPARE - Hydrologist (Medical): No    Lack of Transportation (Non-Medical): No  Physical Activity: Insufficiently Active (06/19/2022)   Exercise Vital Sign    Days of Exercise per Week: 1 day    Minutes of Exercise per Session: 60 min  Stress: No Stress Concern Present (06/19/2022)   [place]    Feeling of Stress : Not at all  Social Connections: [place] (06/19/2022)   Social Connection and Isolation Panel [NHANES]    Frequency of Communication with Friends and Family: More than three times a week    Frequency of Social Gatherings with Friends and Family: More than three times a week    Attends Religious Services: More than 4 times per year    Active Member of [employer] or Organizations: Yes    Attends Music therapist: More than 4 times per year    Marital Status: Married    Tobacco Counseling Counseling given: Not Answered Tobacco comments: I do not and never have smoked.   Clinical Intake:     Pain : No/denies pain     Nutritional Status: BMI 25 -29 Overweight Nutritional Risks: None Diabetes: No CBG done?: No Did pt. bring in CBG monitor from home?: No  How often do you need to have someone help you when you  read instructions, pamphlets, or other written materials from your doctor or pharmacy?: 1 - Never  Diabetic?No  Interpreter Needed?: No  Information entered by :: Donnie Mesa, CMA   Activities of Daily Living    06/19/2022    2:44 PM  In your present state of health, do you have any difficulty performing the following activities:  Hearing? 0  Vision? 1  Comment [facility]  Difficulty concentrating or making decisions? 1  Walking or climbing stairs? 0  Dressing or  bathing? 0  Doing errands, shopping? 0    Patient Care Team: Juline Patch, MD as PCP - General (Family Medicine)  Indicate any recent Medical Services you may have received from other than [facility] providers in the past year (date may be approximate).    No hospitalizations in the past 12 months  Assessment:   This is a routine wellness examination for [place].  Hearing/Vision screen Denies any hearing issues. Pt last eye exam was several years ago at [facility]. She admits that her vision has changed. She plan on scheduling an appt soon for an eye exam.   Dietary issues and exercise activities discussed: Current Exercise Habits: Structured exercise class, Type of exercise: Other - see comments (Aerobics, Drum Class), Time (Minutes): 60, Frequency (Times/Week): 1, Weekly Exercise (Minutes/Week): 60, Intensity: Intense   Goals Addressed             This Visit's Progress    Exercise 150 min/wk Moderate Activity       She will continue to strive to get at least 5,000 steps daily.        Depression Screen    06/19/2022    2:43 PM 06/18/2021    2:54 PM 10/05/2020    2:35 PM 10/28/2017   11:05 AM 10/28/2017   11:03 AM 04/02/2016    9:22 AM  PHQ 2/9 Scores  PHQ - 2 Score 0 0 0 0 0 0  PHQ- 9 Score   0 1      Fall Risk    06/19/2022    2:45 PM 06/18/2021    3:01 PM 10/05/2020    2:35 PM 10/28/2017   11:03 AM 04/02/2016    9:22 AM  [place] in the past year? 0 0 0 No No  Number falls in  past yr: 0 0     Injury with Fall? 0 0     Risk for fall due to : No Fall Risks No Fall Risks     Follow up Falls evaluation completed  Falls evaluation completed      [place]:  Any stairs in or around the home? No  If so, are there any without handrails? No  Home free of loose throw rugs in walkways, pet beds, electrical cords, etc? Yes  Adequate lighting in your home to reduce risk of falls? Yes   ASSISTIVE DEVICES UTILIZED TO PREVENT FALLS:  Life alert? No  Use of a cane, walker or w/c? No  Grab bars in the bathroom? No  Shower chair or bench in shower? No  Elevated toilet seat or a handicapped toilet? No   TIMED UP AND GO:  Was the test performed? No .   Unable to perform. Appointment is virtual.   Cognitive Function:        06/19/2022    2:45 PM  6CIT Screen  What Year? 0 points  What month? 0 points  What time? 0 points  Count back from 20 0 points  Months in reverse 0 points  Repeat phrase 0 points  Total Score 0 points    Immunizations Immunization History  Administered Date(s) Administered   Fluad Quad(high Dose 65+) 07/31/2019   Influenza, High Dose Seasonal PF 10/04/2017   PNEUMOCOCCAL CONJUGATE-20 06/18/2021   Tdap 12/01/2017    TDAP status: Up to date  Flu Vaccine status: Up to date  Pneumococcal vaccine status: Up to date  Covid-19 vaccine status: Declined, Education has  been provided regarding the importance of this vaccine but patient still declined. Advised may receive this vaccine at local pharmacy or Health Dept.or vaccine clinic. Aware to provide a copy of the vaccination record if obtained from local pharmacy or Health Dept. Verbalized acceptance and understanding.  Qualifies for Shingles Vaccine? Yes   Zostavax completed No   Shingrix Completed?: No.    Education has been provided regarding the importance of this vaccine. Patient has been advised to call insurance company to determine out of pocket  expense if they have not yet received this vaccine. Advised may also receive vaccine at local pharmacy or Health Dept. Verbalized acceptance and understanding.  Screening Tests Health Maintenance  Topic Date Due   COVID-19 Vaccine (1) Never done   Zoster Vaccines- Shingrix (1 of 2) Never done   INFLUENZA VACCINE  06/25/2022   MAMMOGRAM  10/23/2023   TETANUS/TDAP  12/02/2027   Pneumonia Vaccine 60+ Years old  Completed   DEXA SCAN  Completed   HPV VACCINES  Aged Out   COLONOSCOPY (Pts 45-48yr Insurance coverage will need to be confirmed)  Discontinued   Hepatitis C Screening  Discontinued    Health Maintenance  Health Maintenance Due  Topic Date Due   COVID-19 Vaccine (1) Never done   Zoster Vaccines- Shingrix (1 of 2) Never done    Colorectal cancer screening: No longer required.   Mammogram status: Completed 10/22/2021. Repeat every year  DEXA Scan:completed  07/02/2021 Lung Cancer Screening: (Low Dose CT Chest recommended if Age 70-80years, 30 pack-year currently smoking OR have quit w/in 15years.) does not qualify.   Lung Cancer Screening Referral: doesn't qualify   Additional Screening:  Hepatitis C Screening: discontinued   Vision Screening: Recommended annual ophthalmology exams for early detection of glaucoma and other disorders of the eye. Is the patient up to date with their annual eye exam?  No  Who is the provider or what is the name of the office in which the patient attends annual eye exams? [facility] If pt is not established with a provider, would they like to be referred to a provider to establish care? No .   Dental Screening: Recommended annual dental exams for proper oral hygiene  Community Resource Referral / Chronic Care Management: CRR required this visit?  No   CCM required this visit?  No      Plan:     I have personally reviewed and noted the following in the patient's chart:   Medical and social history Use of alcohol,  tobacco or illicit drugs  Current medications and supplements including opioid prescriptions.  Functional ability and status Nutritional status Physical activity Advanced directives List of other physicians Hospitalizations, surgeries, and ER visits in previous 12 months Vitals Screenings to include cognitive, depression, and falls Referrals and appointments  In addition, I have reviewed and discussed with patient certain preventive protocols, quality metrics, and best practice recommendations. A written personalized care plan for preventive services as well as general preventive health recommendations were provided to patient.     Ms. CBittman, Thank you for taking time to come for your Medicare Wellness Visit. I appreciate your ongoing commitment to your health goals. Please review the following plan we discussed and let me know if I can assist you in the future.   These are the goals we discussed:  Goals      Exercise 150 min/wk Moderate Activity     She will continue to strive to get  at least 5,000 steps daily.         This is a list of the screening recommended for you and due dates:  Health Maintenance  Topic Date Due   COVID-19 Vaccine (1) Never done   Zoster (Shingles) Vaccine (1 of 2) Never done   Flu Shot  06/25/2022   Mammogram  10/23/2023   Tetanus Vaccine  12/02/2027   Pneumonia Vaccine  Completed   DEXA scan (bone density measurement)  Completed   HPV Vaccine  Aged Out   Colon Cancer Screening  Discontinued   Hepatitis C Screening: USPSTF Recommendation to screen - Ages 49-79 yo.  Discontinued    Wilson Singer, [place]   06/19/2022   Nurse Notes: non-face-to-face 40 minute appointment

## 2022-06-19 NOTE — Patient Instructions (Signed)
Mammogram A mammogram is an X-ray of the breasts. This procedure can screen for and detect any changes that may indicate breast cancer. Mammograms are regularly done beginning at age 70 for women with average risk. A man may have a mammogram if he has a lump or swelling in his breast tissue. A mammogram can also identify other changes and variations in the breast, such as: Inflammation of the breast tissue (mastitis). An infected area that contains a collection of pus (abscess). A fluid-filled sac (cyst). Tumors that are not cancerous (benign). Fibrocystic changes. This is when breast tissue becomes denser and can make the tissue feel rope-like or uneven under the skin. Women at higher risk for breast cancer need earlier and more comprehensive screening for abnormal changes. Breast tomosynthesis, or three-dimensional (3D) mammography, and digital breast tomosynthesis are advanced forms of imaging that create 3D pictures of the breasts. Tell a health care provider: About any allergies you have. If you have breast implants. If you have had previous breast disease, biopsy, or surgery. If you have a family history of breast cancer. If you are breastfeeding. Whether you are pregnant or may be pregnant. What are the risks? Generally, this is a safe procedure. However, problems may occur, including: Exposure to radiation. Radiation levels are very low with this test. The need for more tests. The mammogram fails to detect certain cancers or the results are misinterpreted. Difficulty with detecting breast cancer in women with dense breasts. What happens before the procedure? Schedule your test about 1-2 weeks after your menstrual period if you are menstruating. This is usually when your breasts are the least tender. If you have had a mammogram done at a different facility in the past, get the mammogram X-rays or have them sent to your current exam facility. The new and old images will be  compared. Wash your breasts and underarms on the day of the test. Do not wear deodorants, perfumes, lotions, or powders anywhere on your body on the day of the test. Remove any jewelry from your neck. Wear clothes that you can change into and out of easily. What happens during the procedure?  You will undress from the waist up and put on a gown that opens in the front. You will stand in front of the X-ray machine. Each breast will be placed between two plastic or glass plates. The plates will compress your breast for a few seconds. Try to stay as relaxed as possible. This procedure does not cause any harm to your breasts. Any discomfort you feel will be very brief. X-rays will be taken from different angles of each breast. The procedure may vary among health care providers and hospitals. What can I expect after the procedure? The mammogram will be examined by a specialist (radiologist). You may need to repeat certain parts of the test, depending on the quality of the images. This is done if the radiologist needs a better view of the breast tissue. You may resume your normal activities. It is up to you to get the results of your procedure. Ask your health care provider, or the department that is doing the procedure, when your results will be ready. Summary A mammogram is an X-ray of the breasts. This procedure can screen for and detect any changes that may indicate breast cancer. A man may have a mammogram if he has a lump or swelling in his breast tissue. If you have had a mammogram done at a different facility in the past, get the   mammogram X-rays or have them sent to your current exam facility in order to compare them. Schedule your test about 1-2 weeks after your menstrual period if you are menstruating. Ask when your test results will be ready. Make sure you get your test results. This information is not intended to replace advice given to you by your health care provider. Make sure you  discuss any questions you have with your health care provider. Document Revised: 07/25/2021 Document Reviewed: 09/11/2020 Elsevier Patient Education  2023 Elsevier Inc.  

## 2022-06-28 ENCOUNTER — Encounter: Payer: Self-pay | Admitting: Family Medicine

## 2022-08-27 DIAGNOSIS — E7849 Other hyperlipidemia: Secondary | ICD-10-CM | POA: Diagnosis not present

## 2022-08-27 DIAGNOSIS — I10 Essential (primary) hypertension: Secondary | ICD-10-CM | POA: Diagnosis not present

## 2022-08-28 ENCOUNTER — Other Ambulatory Visit: Payer: Self-pay | Admitting: Family Medicine

## 2022-08-28 DIAGNOSIS — Z1231 Encounter for screening mammogram for malignant neoplasm of breast: Secondary | ICD-10-CM

## 2022-10-15 DIAGNOSIS — H2513 Age-related nuclear cataract, bilateral: Secondary | ICD-10-CM | POA: Diagnosis not present

## 2022-10-23 ENCOUNTER — Ambulatory Visit
Admission: RE | Admit: 2022-10-23 | Discharge: 2022-10-23 | Disposition: A | Discharge status: Home / Self Care | Payer: Medicare PPO | Source: Ambulatory Visit | Attending: Family Medicine | Admitting: Family Medicine

## 2022-10-23 DIAGNOSIS — Z1231 Encounter for screening mammogram for malignant neoplasm of breast: Secondary | ICD-10-CM | POA: Insufficient documentation

## 2022-11-01 DIAGNOSIS — Z0100 Encounter for examination of eyes and vision without abnormal findings: Secondary | ICD-10-CM | POA: Diagnosis not present

## 2022-11-01 DIAGNOSIS — H2513 Age-related nuclear cataract, bilateral: Secondary | ICD-10-CM | POA: Diagnosis not present

## 2022-11-20 DIAGNOSIS — H2511 Age-related nuclear cataract, right eye: Secondary | ICD-10-CM | POA: Diagnosis not present

## 2022-11-21 ENCOUNTER — Encounter: Payer: Self-pay | Admitting: Ophthalmology

## 2022-11-28 NOTE — Discharge Instructions (Signed)

## 2022-12-02 ENCOUNTER — Other Ambulatory Visit: Payer: Self-pay

## 2022-12-02 ENCOUNTER — Ambulatory Visit: Payer: Medicare PPO | Admitting: Anesthesiology

## 2022-12-02 ENCOUNTER — Encounter
Admission: RE | Disposition: A | Discharge status: Home / Self Care | Payer: Self-pay | Source: Ambulatory Visit | Attending: Ophthalmology

## 2022-12-02 ENCOUNTER — Ambulatory Visit
Admission: RE | Admit: 2022-12-02 | Discharge: 2022-12-02 | Disposition: A | Discharge status: Home / Self Care | Payer: Medicare PPO | Source: Ambulatory Visit | Attending: Ophthalmology | Admitting: Ophthalmology

## 2022-12-02 ENCOUNTER — Encounter: Payer: Self-pay | Admitting: Ophthalmology

## 2022-12-02 DIAGNOSIS — H2511 Age-related nuclear cataract, right eye: Secondary | ICD-10-CM | POA: Insufficient documentation

## 2022-12-02 DIAGNOSIS — E785 Hyperlipidemia, unspecified: Secondary | ICD-10-CM | POA: Diagnosis not present

## 2022-12-02 DIAGNOSIS — K219 Gastro-esophageal reflux disease without esophagitis: Secondary | ICD-10-CM | POA: Insufficient documentation

## 2022-12-02 DIAGNOSIS — I10 Essential (primary) hypertension: Secondary | ICD-10-CM | POA: Insufficient documentation

## 2022-12-02 DIAGNOSIS — Z86718 Personal history of other venous thrombosis and embolism: Secondary | ICD-10-CM | POA: Diagnosis not present

## 2022-12-02 DIAGNOSIS — H269 Unspecified cataract: Secondary | ICD-10-CM | POA: Diagnosis not present

## 2022-12-02 DIAGNOSIS — I4891 Unspecified atrial fibrillation: Secondary | ICD-10-CM | POA: Diagnosis not present

## 2022-12-02 HISTORY — DX: Unspecified atrial fibrillation: I48.91

## 2022-12-02 HISTORY — DX: Dizziness and giddiness: R42

## 2022-12-02 HISTORY — PX: CATARACT EXTRACTION W/PHACO: SHX586

## 2022-12-02 SURGERY — PHACOEMULSIFICATION, CATARACT, WITH IOL INSERTION
Anesthesia: Topical | Site: Eye | Laterality: Right

## 2022-12-02 MED ORDER — FENTANYL CITRATE (PF) 100 MCG/2ML IJ SOLN
INTRAMUSCULAR | Status: DC | PRN
  Administered 2022-12-02 (×2): 25 ug via INTRAVENOUS
  Administered 2022-12-02: 50 ug via INTRAVENOUS

## 2022-12-02 MED ORDER — MIDAZOLAM HCL 2 MG/2ML IJ SOLN
INTRAMUSCULAR | Status: DC | PRN
  Administered 2022-12-02: 2 mg via INTRAVENOUS

## 2022-12-02 MED ORDER — MOXIFLOXACIN HCL 0.5 % OP SOLN
OPHTHALMIC | Status: DC | PRN
  Administered 2022-12-02: .2 mL via OPHTHALMIC

## 2022-12-02 MED ORDER — SIGHTPATH DOSE#1 BSS IO SOLN
INTRAOCULAR | Status: DC | PRN
  Administered 2022-12-02: 15 mL

## 2022-12-02 MED ORDER — ARMC OPHTHALMIC DILATING DROPS
1.0000 | OPHTHALMIC | Status: DC | PRN
  Administered 2022-12-02 (×3): 1 via OPHTHALMIC

## 2022-12-02 MED ORDER — SIGHTPATH DOSE#1 NA HYALUR & NA CHOND-NA HYALUR IO KIT
PACK | INTRAOCULAR | Status: DC | PRN
  Administered 2022-12-02: 1 via OPHTHALMIC

## 2022-12-02 MED ORDER — LIDOCAINE HCL (PF) 2 % IJ SOLN
INTRAOCULAR | Status: DC | PRN
  Administered 2022-12-02: 1 mL via INTRAOCULAR

## 2022-12-02 MED ORDER — TETRACAINE HCL 0.5 % OP SOLN
1.0000 [drp] | OPHTHALMIC | Status: DC | PRN
  Administered 2022-12-02 (×3): 1 [drp] via OPHTHALMIC

## 2022-12-02 MED ORDER — SIGHTPATH DOSE#1 BSS IO SOLN
INTRAOCULAR | Status: DC | PRN
  Administered 2022-12-02: 78 mL via OPHTHALMIC

## 2022-12-02 SURGICAL SUPPLY — 13 items
CATARACT SUITE SIGHTPATH (MISCELLANEOUS) ×1 IMPLANT
DISSECTOR HYDRO NUCLEUS 50X22 (MISCELLANEOUS) ×1 IMPLANT
FEE CATARACT SUITE SIGHTPATH (MISCELLANEOUS) ×1 IMPLANT
GLOVE SURG GAMMEX PI TX LF 7.5 (GLOVE) ×1 IMPLANT
GLOVE SURG SYN 8.5  E (GLOVE) ×1
GLOVE SURG SYN 8.5 E (GLOVE) ×1 IMPLANT
GLOVE SURG SYN 8.5 PF PI (GLOVE) ×1 IMPLANT
LENS IOL TECNIS EYHANCE 17.5 (Intraocular Lens) IMPLANT
NDL FILTER BLUNT 18X1 1/2 (NEEDLE) ×1 IMPLANT
NEEDLE FILTER BLUNT 18X1 1/2 (NEEDLE) ×1 IMPLANT
SYR 3ML LL SCALE MARK (SYRINGE) ×1 IMPLANT
SYR 5ML LL (SYRINGE) ×1 IMPLANT
WATER STERILE IRR 250ML POUR (IV SOLUTION) ×1 IMPLANT

## 2022-12-02 NOTE — Transfer of Care (Signed)
Immediate Anesthesia Transfer of Care Note  Patient: Angela Sparks  Procedure(s) Performed: CATARACT EXTRACTION PHACO AND INTRAOCULAR LENS PLACEMENT (IOC) RIGHT  4.46  00:32.6 (Right: Eye)  Patient Location: PACU  Anesthesia Type: No value filed.  Level of Consciousness: awake, alert  and patient cooperative  Airway and Oxygen Therapy: Patient Spontanous Breathing and Patient connected to supplemental oxygen  Post-op Assessment: Post-op Vital signs reviewed, Patient's Cardiovascular Status Stable, Respiratory Function Stable, Patent Airway and No signs of Nausea or vomiting  Post-op Vital Signs: Reviewed and stable  Complications: No notable events documented.

## 2022-12-02 NOTE — H&P (Signed)
[facility]   Primary Care Physician:  Juline Patch, MD Ophthalmologist: Dr. Benay Pillow  Pre-Procedure History & Physical: HPI:  Ladean L Couvillon is a 72 y.o. female here for cataract surgery.   Past Medical History:  Diagnosis Date   Allergy    Atrial fibrillation ([place])    DVT (deep venous thrombosis) ([place])    before 2015   GERD (gastroesophageal reflux disease)    I have had acid reflux for many years now.   Heart murmur    Was diagnosed when I was a child. But was told I outgrew it.   Hyperlipidemia    Hypertension    Multiple thyroid nodules    PE (pulmonary embolism)    before 2015   Vertigo     Past Surgical History:  Procedure Laterality Date   BREAST BIOPSY Right 10/28/2012   [place]  bx-neg   BREAST BIOPSY Right 10/15/2013   [place] bx/clip-neg   COLONOSCOPY  2007   DOPPLER ECHOCARDIOGRAPHY  07/07/2009   NM MYOVIEW LTD  07/07/2009   TONSILLECTOMY     age 2   TRANSTHORACIC ECHOCARDIOGRAM  07/07/2009    Prior to Admission medications   Medication Sig Start Date End Date Taking? Authorizing Provider  aspirin EC 81 MG tablet Take 1 tablet by mouth daily.   Yes [provider]  clopidogrel (PLAVIX) 75 MG tablet Take 1 tablet (75 mg total) by mouth daily. NO MORE REFILLS W/O APPT Patient taking differently: Take 75 mg by mouth daily. Dr Ubaldo Glassing 05/26/14  Yes Lorretta Harp, MD  esomeprazole (NEXIUM) 20 MG capsule Take 1 capsule by mouth 2 (two) times daily. otc   Yes [provider]  losartan (COZAAR) 50 MG tablet Take 1 tablet (50 mg total) by mouth daily. NO MORE REFILLS W/O APPT Patient taking differently: Take 50 mg by mouth daily. Dr Ubaldo Glassing 05/25/14  Yes Lorretta Harp, MD  metoprolol succinate (TOPROL XL) 25 MG 24 hr tablet Take 1 tablet (25 mg total) by mouth daily. NO MORE REFILLS W/O APPT Patient taking differently: Take 25 mg by mouth daily. Fath 05/25/14  Yes Lorretta Harp, MD    Allergies as of 11/08/2022 - Review Complete  06/19/2022  Allergen Reaction Noted   Codeine Other (See Comments) 02/02/2016    Family History  Problem Relation Age of Onset   Diabetes Mother    Thyroid disease Mother    Stroke Mother    CAD Father    Breast cancer Neg Hx     Social History   Socioeconomic History   Marital status: Married    Spouse name: Walterine Amodei   Number of children: 3   Years of education: Not on file   Highest education level: Not on file  Occupational History   Occupation: Retired  Tobacco Use   Smoking status: Never   Smokeless tobacco: Never   Tobacco comments:    I do not and never have smoked.  Vaping Use   Vaping Use: Never used  Substance and Sexual Activity   Alcohol use: Never   Drug use: Never   Sexual activity: Not on file  Other Topics Concern   Not on file  Social History Narrative   Not on file   Social Determinants of Health   Financial Resource Strain: Low Risk  (06/19/2022)   Overall Financial Resource Strain (CARDIA)    Difficulty of Paying Living Expenses: Not hard at all  Food Insecurity: No Food Insecurity (06/19/2022)  Hunger Vital Sign    Worried About Running Out of Food in the Last Year: Never true    Ran Out of Food in the Last Year: Never true  Transportation Needs: No Transportation Needs (06/19/2022)   PRAPARE - Hydrologist (Medical): No    Lack of Transportation (Non-Medical): No  Physical Activity: Insufficiently Active (06/19/2022)   Exercise Vital Sign    Days of Exercise per Week: 1 day    Minutes of Exercise per Session: 60 min  Stress: No Stress Concern Present (06/19/2022)   [place]    Feeling of Stress : Not at all  Social Connections: [place] (06/19/2022)   Social Connection and Isolation Panel [NHANES]    Frequency of Communication with Friends and Family: More than three times a week    Frequency of Social Gatherings with Friends  and Family: More than three times a week    Attends Religious Services: More than 4 times per year    Active Member of [employer] or Organizations: Yes    Attends Music therapist: More than 4 times per year    Marital Status: Married  Human resources officer Violence: Not At Risk (06/18/2021)   Humiliation, Afraid, Rape, and Kick questionnaire    Fear of Current or Ex-Partner: No    Emotionally Abused: No    Physically Abused: No    Sexually Abused: No    Review of Systems: See HPI, otherwise negative ROS  Physical Exam: BP (!) 150/76   Pulse 75   Temp 98.5 F (36.9 C) (Temporal)   Resp 14   Ht '5\' 6"'$  (1.676 m)   Wt 79.4 kg   SpO2 95%   BMI 28.25 kg/m  General:   Alert, cooperative in NAD Head:  Normocephalic and atraumatic. Respiratory:  Normal work of breathing. Cardiovascular:  RRR  Impression/Plan: Angela Sparks is here for cataract surgery.  Risks, benefits, limitations, and alternatives regarding cataract surgery have been reviewed with the patient.  Questions have been answered.  All parties agreeable.   Benay Pillow, MD  12/02/2022, 1:22 PM

## 2022-12-02 NOTE — Op Note (Signed)
OPERATIVE NOTE  MARKEIA HARKLESS [id number] 12/02/2022   PREOPERATIVE DIAGNOSIS:  Nuclear sclerotic cataract right eye.  H25.11   POSTOPERATIVE DIAGNOSIS:    Nuclear sclerotic cataract right eye.     PROCEDURE:  Phacoemusification with posterior chamber intraocular lens placement of the right eye   LENS:   Implant Name Type Inv. Item Serial No. Manufacturer Lot No. LRB No. Used Action  LENS IOL TECNIS EYHANCE 17.5 - [id number] Intraocular Lens LENS IOL TECNIS EYHANCE 17.5 [id number] [employer]  Right 1 Implanted       Procedure(s): CATARACT EXTRACTION PHACO AND INTRAOCULAR LENS PLACEMENT (IOC) RIGHT  4.46  00:32.6 (Right)  DIB00 +17.5   ULTRASOUND TIME: 0 minutes 32 seconds.  CDE 4.46   SURGEON:  Benay Pillow, MD, MPH  ANESTHESIOLOGIST: Anesthesiologist: Molli Barrows, MD CRNA: Jacqualin Combes, CRNA   ANESTHESIA:  Topical with tetracaine drops augmented with 1% preservative-free intracameral lidocaine.  ESTIMATED BLOOD LOSS: less than 1 mL.   COMPLICATIONS:  None.   DESCRIPTION OF PROCEDURE:  The patient was identified in the holding room and transported to the operating room and placed in the supine position under the operating microscope.  The right eye was identified as the operative eye and it was prepped and draped in the usual sterile ophthalmic fashion.   A 1.0 millimeter clear-corneal paracentesis was made at the 10:30 position. 0.5 ml of preservative-free 1% lidocaine with epinephrine was injected into the anterior chamber.  The anterior chamber was filled with viscoelastic.  A 2.4 millimeter keratome was used to make a near-clear corneal incision at the 8:00 position.  A curvilinear capsulorrhexis was made with a cystotome and capsulorrhexis forceps.  Balanced salt solution was used to hydrodissect and hydrodelineate the nucleus.   Phacoemulsification was then used in stop and chop fashion to remove the lens nucleus and epinucleus.  The remaining cortex was then  removed using the irrigation and aspiration handpiece. Viscoelastic was then placed into the capsular bag to distend it for lens placement.  A lens was then injected into the capsular bag.  The remaining viscoelastic was aspirated.   Wounds were hydrated with balanced salt solution.  The anterior chamber was inflated to a physiologic pressure with balanced salt solution.   Intracameral vigamox 0.1 mL undiluted was injected into the eye and a drop placed onto the ocular surface.  No wound leaks were noted.  The patient was taken to the recovery room in stable condition without complications of anesthesia or surgery  Benay Pillow 12/02/2022, 1:49 PM

## 2022-12-03 ENCOUNTER — Encounter: Payer: Self-pay | Admitting: Ophthalmology

## 2022-12-03 NOTE — Anesthesia Preprocedure Evaluation (Signed)
Anesthesia Evaluation  Patient identified by MRN, date of birth, ID band Patient awake    Reviewed: Allergy & Precautions, H&P , NPO status , Patient's Chart, lab work & pertinent test results, reviewed documented beta blocker date and time   Airway Mallampati: II  TM Distance: >3 FB Neck ROM: full    Dental no notable dental hx. (+) Poor Dentition   Pulmonary neg pulmonary ROS   Pulmonary exam normal breath sounds clear to auscultation       Cardiovascular Exercise Tolerance: Poor hypertension, On Medications Normal cardiovascular exam+ Valvular Problems/Murmurs  Rhythm:regular Rate:Normal     Neuro/Psych negative neurological ROS  negative psych ROS   GI/Hepatic Neg liver ROS,GERD  Medicated,,  Endo/Other  negative endocrine ROS    Renal/GU negative Renal ROS  negative genitourinary   Musculoskeletal negative musculoskeletal ROS (+)    Abdominal   Peds negative pediatric ROS (+)  Hematology negative hematology ROS (+)   Anesthesia Other Findings Past Medical History: No date: Allergy No date: Atrial fibrillation (HCC) No date: DVT (deep venous thrombosis) (HCC)     Comment:  before 2015 No date: GERD (gastroesophageal reflux disease)     Comment:  I have had acid reflux for many years now. No date: Heart murmur     Comment:  Was diagnosed when I was a child. But was told I outgrew              it. No date: Hyperlipidemia No date: Hypertension No date: Multiple thyroid nodules No date: PE (pulmonary embolism)     Comment:  before 2015 No date: Vertigo Past Surgical History: 10/28/2012: BREAST BIOPSY; Right     Comment:  [place]  bx-neg 10/15/2013: BREAST BIOPSY; Right     Comment:  [place] bx/clip-neg 2007: COLONOSCOPY 07/07/2009: DOPPLER ECHOCARDIOGRAPHY 07/07/2009: NM MYOVIEW LTD No date: TONSILLECTOMY     Comment:  age 37 07/07/2009: TRANSTHORACIC ECHOCARDIOGRAM BMI    Body Mass Index: 28.25 kg/m      Reproductive/Obstetrics negative OB ROS                             Anesthesia Physical Anesthesia Plan  ASA: 3  Anesthesia Plan: MAC   Post-op Pain Management:    Induction:   PONV Risk Score and Plan:   Airway Management Planned:   Additional Equipment:   Intra-op Plan:   Post-operative Plan:   Informed Consent: I have reviewed the patients History and Physical, chart, labs and discussed the procedure including the risks, benefits and alternatives for the proposed anesthesia with the patient or authorized representative who has indicated his/her understanding and acceptance.     Dental Advisory Given  Plan Discussed with: CRNA  Anesthesia Plan Comments:        Anesthesia Quick Evaluation

## 2022-12-03 NOTE — Anesthesia Postprocedure Evaluation (Signed)
Anesthesia Post Note  Patient: Angela Sparks  Procedure(s) Performed: CATARACT EXTRACTION PHACO AND INTRAOCULAR LENS PLACEMENT (IOC) RIGHT  4.46  00:32.6 (Right: Eye)  Patient location during evaluation: PACU Anesthesia Type: MAC Level of consciousness: awake and alert Pain management: pain level controlled Vital Signs Assessment: post-procedure vital signs reviewed and stable Respiratory status: spontaneous breathing, nonlabored ventilation, respiratory function stable and patient connected to nasal cannula oxygen Cardiovascular status: stable and blood pressure returned to baseline Postop Assessment: no apparent nausea or vomiting Anesthetic complications: no   No notable events documented.   Last Vitals:  Vitals:   12/02/22 1351 12/02/22 1355  BP: 124/78 122/74  Pulse: 64 63  Resp: 14 12  Temp: 36.7 C 36.7 C  SpO2: 93% 93%    Last Pain:  Vitals:   12/02/22 1355  TempSrc:   PainSc: 0-No pain                 Molli Barrows

## 2022-12-13 NOTE — Discharge Instructions (Signed)
   Cataract Surgery, Care After ? ?This sheet gives you information about how to care for yourself after your surgery.  Your ophthalmologist may also give you more specific instructions.  If you have problems or questions, contact your doctor at [facility], [phone]. ? ?What can I expect after the surgery? ?It is common to have: ?Itching ?Foreign body sensation (feels like a grain of sand in the eye) ?Watery discharge (excess tearing) ?Sensitivity to light and touch ?Bruising in or around the eye ?Mild blurred vision ? ?Follow these instructions at home: ?Do not touch or rub your eyes. ?You may be told to wear a protective shield or sunglasses to protect your eyes. ?Do not put a contact lens in the operative eye unless your doctor approves. ?Keep the lids and face clean and dry. ?Do not allow water to hit you directly in the face while showering. ?Keep soap and shampoo out of your eyes. ?Do not use eye makeup for 1 week. ? ?Check your eye every day for signs of infection.  Watch for: ?Redness, swelling, or pain. ?Fluid, blood or pus. ?Worsening vision. ?Worsening sensitivity to light or touch. ? ?Activity: ?During the first day, avoid bending over and reading.  You may resume reading and bending the next day. ?Do not drive or use heavy machinery for at least 24 hours. ?Avoid strenuous activities for 1 week.  Activities such as walking, treadmill, exercise bike, and climbing stairs are okay. ?Do not lift heavy (>20 pound) objects for 1 week. ?Do not do yardwork, gardening, or dirty housework (mopping, cleaning bathrooms, vacuuming, etc.) for 1 week. ?Do not swim or use a hot tub for 2 weeks. ?Ask your doctor when you can return to work. ? ?General Instructions: ?Take or apply prescription and over-the-counter medicines as directed by your doctor, including eyedrops and ointments. ?Resume medications discontinued prior to surgery, unless told otherwise by your doctor. ?Keep all follow up appointments as  scheduled. ? ?Contact a health care provider if: ?You have increased bruising around your eye. ?You have pain that is not helped with medication. ?You have a fever. ?You have fluid, pus, or blood coming from your eye or incision. ?Your sensitivity to light gets worse. ?You have spots (floaters) of flashing lights in your vision. ?You have nausea or vomiting. ? ?Go to the nearest emergency room or call 911 if: ?You have sudden loss of vision. ?You have severe, worsening eye pain. ? ?

## 2022-12-16 ENCOUNTER — Ambulatory Visit: Payer: Medicare PPO | Admitting: Anesthesiology

## 2022-12-16 ENCOUNTER — Ambulatory Visit
Admission: RE | Admit: 2022-12-16 | Discharge: 2022-12-16 | Disposition: A | Discharge status: Home / Self Care | Payer: Medicare PPO | Source: Ambulatory Visit | Attending: Ophthalmology | Admitting: Ophthalmology

## 2022-12-16 ENCOUNTER — Encounter: Payer: Self-pay | Admitting: Ophthalmology

## 2022-12-16 ENCOUNTER — Encounter
Admission: RE | Disposition: A | Discharge status: Home / Self Care | Payer: Self-pay | Source: Ambulatory Visit | Attending: Ophthalmology

## 2022-12-16 DIAGNOSIS — E785 Hyperlipidemia, unspecified: Secondary | ICD-10-CM | POA: Diagnosis not present

## 2022-12-16 DIAGNOSIS — H269 Unspecified cataract: Secondary | ICD-10-CM | POA: Diagnosis not present

## 2022-12-16 DIAGNOSIS — Z86718 Personal history of other venous thrombosis and embolism: Secondary | ICD-10-CM | POA: Insufficient documentation

## 2022-12-16 DIAGNOSIS — I4891 Unspecified atrial fibrillation: Secondary | ICD-10-CM | POA: Diagnosis not present

## 2022-12-16 DIAGNOSIS — K219 Gastro-esophageal reflux disease without esophagitis: Secondary | ICD-10-CM | POA: Diagnosis not present

## 2022-12-16 DIAGNOSIS — Z7902 Long term (current) use of antithrombotics/antiplatelets: Secondary | ICD-10-CM | POA: Diagnosis not present

## 2022-12-16 DIAGNOSIS — I10 Essential (primary) hypertension: Secondary | ICD-10-CM | POA: Insufficient documentation

## 2022-12-16 DIAGNOSIS — Z86711 Personal history of pulmonary embolism: Secondary | ICD-10-CM | POA: Diagnosis not present

## 2022-12-16 DIAGNOSIS — Z8249 Family history of ischemic heart disease and other diseases of the circulatory system: Secondary | ICD-10-CM | POA: Insufficient documentation

## 2022-12-16 DIAGNOSIS — H2512 Age-related nuclear cataract, left eye: Secondary | ICD-10-CM | POA: Insufficient documentation

## 2022-12-16 HISTORY — PX: CATARACT EXTRACTION W/PHACO: SHX586

## 2022-12-16 SURGERY — PHACOEMULSIFICATION, CATARACT, WITH IOL INSERTION
Anesthesia: Monitor Anesthesia Care | Site: Eye | Laterality: Left

## 2022-12-16 MED ORDER — SIGHTPATH DOSE#1 BSS IO SOLN
INTRAOCULAR | Status: DC | PRN
  Administered 2022-12-16: 84 mL via OPHTHALMIC

## 2022-12-16 MED ORDER — SODIUM CHLORIDE 0.9% FLUSH
INTRAVENOUS | Status: DC | PRN
  Administered 2022-12-16: 3 mL via INTRAVENOUS

## 2022-12-16 MED ORDER — SIGHTPATH DOSE#1 NA HYALUR & NA CHOND-NA HYALUR IO KIT
PACK | INTRAOCULAR | Status: DC | PRN
  Administered 2022-12-16: 1 via OPHTHALMIC

## 2022-12-16 MED ORDER — LACTATED RINGERS IV SOLN: INTRAVENOUS | Status: DC

## 2022-12-16 MED ORDER — MIDAZOLAM HCL 2 MG/2ML IJ SOLN
INTRAMUSCULAR | Status: DC | PRN
  Administered 2022-12-16: 2 mg via INTRAVENOUS

## 2022-12-16 MED ORDER — ACETAMINOPHEN 160 MG/5ML PO SOLN: 325.0000 mg | ORAL | Status: DC | PRN

## 2022-12-16 MED ORDER — LIDOCAINE HCL (PF) 2 % IJ SOLN
INTRAOCULAR | Status: DC | PRN
  Administered 2022-12-16: 1 mL via INTRAOCULAR

## 2022-12-16 MED ORDER — MOXIFLOXACIN HCL 0.5 % OP SOLN
OPHTHALMIC | Status: DC | PRN
  Administered 2022-12-16: .2 mL via OPHTHALMIC

## 2022-12-16 MED ORDER — SIGHTPATH DOSE#1 BSS IO SOLN
INTRAOCULAR | Status: DC | PRN
  Administered 2022-12-16: 15 mL

## 2022-12-16 MED ORDER — ARMC OPHTHALMIC DILATING DROPS
1.0000 | OPHTHALMIC | Status: DC | PRN
  Administered 2022-12-16 (×3): 1 via OPHTHALMIC

## 2022-12-16 MED ORDER — TETRACAINE HCL 0.5 % OP SOLN
1.0000 [drp] | OPHTHALMIC | Status: DC | PRN
  Administered 2022-12-16 (×3): 1 [drp] via OPHTHALMIC

## 2022-12-16 MED ORDER — ACETAMINOPHEN 325 MG PO TABS: 650.0000 mg | ORAL_TABLET | Freq: Once | ORAL | Status: DC | PRN

## 2022-12-16 MED ORDER — ONDANSETRON HCL 4 MG/2ML IJ SOLN: 4.0000 mg | Freq: Once | INTRAMUSCULAR | Status: DC | PRN

## 2022-12-16 SURGICAL SUPPLY — 13 items
CATARACT SUITE SIGHTPATH (MISCELLANEOUS) ×1 IMPLANT
DISSECTOR HYDRO NUCLEUS 50X22 (MISCELLANEOUS) ×1 IMPLANT
FEE CATARACT SUITE SIGHTPATH (MISCELLANEOUS) ×1 IMPLANT
GLOVE SURG GAMMEX PI TX LF 7.5 (GLOVE) ×1 IMPLANT
GLOVE SURG SYN 8.5  E (GLOVE) ×1
GLOVE SURG SYN 8.5 E (GLOVE) ×1 IMPLANT
GLOVE SURG SYN 8.5 PF PI (GLOVE) ×1 IMPLANT
LENS IOL TECNIS EYHANCE 17.5 (Intraocular Lens) IMPLANT
NDL FILTER BLUNT 18X1 1/2 (NEEDLE) ×1 IMPLANT
NEEDLE FILTER BLUNT 18X1 1/2 (NEEDLE) ×1 IMPLANT
SYR 3ML LL SCALE MARK (SYRINGE) ×1 IMPLANT
SYR 5ML LL (SYRINGE) ×1 IMPLANT
WATER STERILE IRR 250ML POUR (IV SOLUTION) ×1 IMPLANT

## 2022-12-16 NOTE — Anesthesia Postprocedure Evaluation (Signed)
Anesthesia Post Note  Patient: Angela Sparks  Procedure(s) Performed: CATARACT EXTRACTION PHACO AND INTRAOCULAR LENS PLACEMENT (IOC) LEFT  2.55  00:27.4 (Left: Eye)  Patient location during evaluation: PACU Anesthesia Type: MAC Level of consciousness: awake and alert, oriented and patient cooperative Pain management: pain level controlled Vital Signs Assessment: post-procedure vital signs reviewed and stable Respiratory status: spontaneous breathing, nonlabored ventilation and respiratory function stable Cardiovascular status: blood pressure returned to baseline and stable Postop Assessment: adequate PO intake Anesthetic complications: no   No notable events documented.   Last Vitals:  Vitals:   12/16/22 1030 12/16/22 1034  BP: 132/72 135/64  Pulse: 64 (!) 58  Resp: 14 15  Temp: 36.4 C 36.4 C  SpO2: 98% 96%    Last Pain:  Vitals:   12/16/22 1034  PainSc: 0-No pain                 Darrin Nipper

## 2022-12-16 NOTE — Anesthesia Preprocedure Evaluation (Addendum)
Anesthesia Evaluation  Patient identified by MRN, date of birth, ID band Patient awake    Reviewed: Allergy & Precautions, NPO status , Patient's Chart, lab work & pertinent test results  History of Anesthesia Complications Negative for: history of anesthetic complications  Airway Mallampati: IV   Neck ROM: Full    Dental  (+) Missing   Pulmonary neg pulmonary ROS   Pulmonary exam normal breath sounds clear to auscultation       Cardiovascular hypertension, Normal cardiovascular exam Rhythm:Regular Rate:Normal  Hx PE/DVT on Plavix   Neuro/Psych Vertigo     GI/Hepatic ,GERD  ,,  Endo/Other  negative endocrine ROS    Renal/GU negative Renal ROS     Musculoskeletal   Abdominal   Peds  Hematology negative hematology ROS (+)   Anesthesia Other Findings   Reproductive/Obstetrics                             Anesthesia Physical Anesthesia Plan  ASA: 2  Anesthesia Plan: MAC   Post-op Pain Management:    Induction: Intravenous  PONV Risk Score and Plan: 2 and Treatment may vary due to age or medical condition, Midazolam and TIVA  Airway Management Planned: Natural Airway and Nasal Cannula  Additional Equipment:   Intra-op Plan:   Post-operative Plan:   Informed Consent: I have reviewed the patients History and Physical, chart, labs and discussed the procedure including the risks, benefits and alternatives for the proposed anesthesia with the patient or authorized representative who has indicated his/her understanding and acceptance.     Dental advisory given  Plan Discussed with: CRNA  Anesthesia Plan Comments: (LMA/GETA backup discussed.  Patient consented for risks of anesthesia including but not limited to:  - adverse reactions to medications - damage to eyes, teeth, lips or other oral mucosa - nerve damage due to positioning  - sore throat or hoarseness - damage to  heart, brain, nerves, lungs, other parts of body or loss of life  Informed patient about role of CRNA in peri- and intra-operative care.  Patient voiced understanding.)        Anesthesia Quick Evaluation

## 2022-12-16 NOTE — H&P (Signed)
[facility]   Primary Care Physician:  Juline Patch, MD Ophthalmologist: Dr. Benay Pillow  Pre-Procedure History & Physical: HPI:  Angela Sparks is a 72 y.o. female here for cataract surgery.   Past Medical History:  Diagnosis Date   Allergy    Atrial fibrillation ([place])    DVT (deep venous thrombosis) ([place])    before 2015   GERD (gastroesophageal reflux disease)    I have had acid reflux for many years now.   Heart murmur    Was diagnosed when I was a child. But was told I outgrew it.   Hyperlipidemia    Hypertension    Multiple thyroid nodules    PE (pulmonary embolism)    before 2015   Vertigo     Past Surgical History:  Procedure Laterality Date   BREAST BIOPSY Right 10/28/2012   [place]  bx-neg   BREAST BIOPSY Right 10/15/2013   [place] bx/clip-neg   CATARACT EXTRACTION W/PHACO Right 12/02/2022   Procedure: CATARACT EXTRACTION PHACO AND INTRAOCULAR LENS PLACEMENT (IOC) RIGHT  4.46  00:32.6;  Surgeon: Eulogio Bear, MD;  Location: [place];  Service: Ophthalmology;  Laterality: Right;   COLONOSCOPY  2007   DOPPLER ECHOCARDIOGRAPHY  07/07/2009   NM MYOVIEW LTD  07/07/2009   TONSILLECTOMY     age 73   TRANSTHORACIC ECHOCARDIOGRAM  07/07/2009    Prior to Admission medications   Medication Sig Start Date End Date Taking? Authorizing Provider  aspirin EC 81 MG tablet Take 1 tablet by mouth daily.   Yes [provider]  esomeprazole (NEXIUM) 20 MG capsule Take 1 capsule by mouth 2 (two) times daily. otc   Yes [provider]  metoprolol succinate (TOPROL XL) 25 MG 24 hr tablet Take 1 tablet (25 mg total) by mouth daily. NO MORE REFILLS W/O APPT Patient taking differently: Take 25 mg by mouth daily. Fath 05/25/14  Yes Lorretta Harp, MD  clopidogrel (PLAVIX) 75 MG tablet Take 1 tablet (75 mg total) by mouth daily. NO MORE REFILLS W/O APPT Patient taking differently: Take 75 mg by mouth daily. Dr Ubaldo Glassing 05/26/14   Lorretta Harp, MD   losartan (COZAAR) 50 MG tablet Take 1 tablet (50 mg total) by mouth daily. NO MORE REFILLS W/O APPT Patient taking differently: Take 50 mg by mouth daily. Dr Ubaldo Glassing 05/25/14   Lorretta Harp, MD    Allergies as of 11/08/2022 - Review Complete 06/19/2022  Allergen Reaction Noted   Codeine Other (See Comments) 02/02/2016    Family History  Problem Relation Age of Onset   Diabetes Mother    Thyroid disease Mother    Stroke Mother    CAD Father    Breast cancer Neg Hx     Social History   Socioeconomic History   Marital status: Married    Spouse name: [name]   Number of children: 3   Years of education: Not on file   Highest education level: Not on file  Occupational History   Occupation: Retired  Tobacco Use   Smoking status: Never   Smokeless tobacco: Never   Tobacco comments:    I do not and never have smoked.  Vaping Use   Vaping Use: Never used  Substance and Sexual Activity   Alcohol use: Never   Drug use: Never   Sexual activity: Not on file  Other Topics Concern   Not on file  Social History Narrative   Not on file  Social Determinants of Health   Financial Resource Strain: Low Risk  (06/19/2022)   Overall Financial Resource Strain (CARDIA)    Difficulty of Paying Living Expenses: Not hard at all  Food Insecurity: No Food Insecurity (06/19/2022)   Hunger Vital Sign    Worried About Running Out of Food in the Last Year: Never true    Ran Out of Food in the Last Year: Never true  Transportation Needs: No Transportation Needs (06/19/2022)   PRAPARE - Hydrologist (Medical): No    Lack of Transportation (Non-Medical): No  Physical Activity: Insufficiently Active (06/19/2022)   Exercise Vital Sign    Days of Exercise per Week: 1 day    Minutes of Exercise per Session: 60 min  Stress: No Stress Concern Present (06/19/2022)   [place]    Feeling of Stress :  Not at all  Social Connections: [place] (06/19/2022)   Social Connection and Isolation Panel [NHANES]    Frequency of Communication with Friends and Family: More than three times a week    Frequency of Social Gatherings with Friends and Family: More than three times a week    Attends Religious Services: More than 4 times per year    Active Member of [employer] or Organizations: Yes    Attends Music therapist: More than 4 times per year    Marital Status: Married  Human resources officer Violence: Not At Risk (06/18/2021)   Humiliation, Afraid, Rape, and Kick questionnaire    Fear of Current or Ex-Partner: No    Emotionally Abused: No    Physically Abused: No    Sexually Abused: No    Review of Systems: See HPI, otherwise negative ROS  Physical Exam: BP (!) 158/71   Pulse 67   Temp (!) 96.8 F (36 C)   Wt 80.3 kg   SpO2 95%   BMI 28.57 kg/m  General:   Alert, cooperative in NAD Head:  Normocephalic and atraumatic. Respiratory:  Normal work of breathing. Cardiovascular:  RRR  Impression/Plan: Angela Sparks is here for cataract surgery.  Risks, benefits, limitations, and alternatives regarding cataract surgery have been reviewed with the patient.  Questions have been answered.  All parties agreeable.   Benay Pillow, MD  12/16/2022, 10:05 AM

## 2022-12-16 NOTE — Transfer of Care (Signed)
Immediate Anesthesia Transfer of Care Note  Patient: Angela Sparks  Procedure(s) Performed: CATARACT EXTRACTION PHACO AND INTRAOCULAR LENS PLACEMENT (IOC) LEFT  2.55  00:27.4 (Left: Eye)  Patient Location: PACU  Anesthesia Type: MAC  Level of Consciousness: awake, alert  and patient cooperative  Airway and Oxygen Therapy: Patient Spontanous Breathing and Patient connected to supplemental oxygen  Post-op Assessment: Post-op Vital signs reviewed, Patient's Cardiovascular Status Stable, Respiratory Function Stable, Patent Airway and No signs of Nausea or vomiting  Post-op Vital Signs: Reviewed and stable  Complications: No notable events documented.

## 2022-12-16 NOTE — Op Note (Signed)
OPERATIVE NOTE  Angela Sparks [id number] 12/16/2022   PREOPERATIVE DIAGNOSIS:  Nuclear sclerotic cataract left eye.  H25.12   POSTOPERATIVE DIAGNOSIS:    Nuclear sclerotic cataract left eye.     PROCEDURE:  Phacoemusification with posterior chamber intraocular lens placement of the left eye   LENS:   Implant Name Type Inv. Item Serial No. Manufacturer Lot No. LRB No. Used Action  LENS IOL TECNIS EYHANCE 17.5 - [id number] Intraocular Lens LENS IOL TECNIS EYHANCE 17.5 [id number] [employer]  Left 1 Implanted      Procedure(s): CATARACT EXTRACTION PHACO AND INTRAOCULAR LENS PLACEMENT (IOC) LEFT  2.55  00:27.4 (Left)  DIB00 +17.5   ULTRASOUND TIME: 0 minutes 27 seconds.  CDE 2.55   SURGEON:  Benay Pillow, MD, MPH   ANESTHESIA:  Topical with tetracaine drops augmented with 1% preservative-free intracameral lidocaine.  ESTIMATED BLOOD LOSS: <1 mL   COMPLICATIONS:  None.   DESCRIPTION OF PROCEDURE:  The patient was identified in the holding room and transported to the operating room and placed in the supine position under the operating microscope.  The left eye was identified as the operative eye and it was prepped and draped in the usual sterile ophthalmic fashion.   A 1.0 millimeter clear-corneal paracentesis was made at the 5:00 position. 0.5 ml of preservative-free 1% lidocaine with epinephrine was injected into the anterior chamber.  The anterior chamber was filled with viscoelastic.  A 2.4 millimeter keratome was used to make a near-clear corneal incision at the 2:00 position.  A curvilinear capsulorrhexis was made with a cystotome and capsulorrhexis forceps.  Balanced salt solution was used to hydrodissect and hydrodelineate the nucleus.   Phacoemulsification was then used in stop and chop fashion to remove the lens nucleus and epinucleus.  The remaining cortex was then removed using the irrigation and aspiration handpiece. Viscoelastic was then placed into the capsular bag to  distend it for lens placement.  A lens was then injected into the capsular bag.  The remaining viscoelastic was aspirated.   Wounds were hydrated with balanced salt solution.  The anterior chamber was inflated to a physiologic pressure with balanced salt solution.  Intracameral vigamox 0.1 mL undiltued was injected into the eye and a drop placed onto the ocular surface.  No wound leaks were noted.  The patient was taken to the recovery room in stable condition without complications of anesthesia or surgery  Benay Pillow 12/16/2022, 10:28 AM

## 2022-12-18 ENCOUNTER — Encounter: Payer: Self-pay | Admitting: Ophthalmology

## 2023-01-07 DIAGNOSIS — Z0100 Encounter for examination of eyes and vision without abnormal findings: Secondary | ICD-10-CM | POA: Diagnosis not present

## 2023-04-01 DIAGNOSIS — E7849 Other hyperlipidemia: Secondary | ICD-10-CM | POA: Diagnosis not present

## 2023-04-01 DIAGNOSIS — I82409 Acute embolism and thrombosis of unspecified deep veins of unspecified lower extremity: Secondary | ICD-10-CM | POA: Diagnosis not present

## 2023-04-01 DIAGNOSIS — I498 Other specified cardiac arrhythmias: Secondary | ICD-10-CM | POA: Diagnosis not present

## 2023-04-01 DIAGNOSIS — I10 Essential (primary) hypertension: Secondary | ICD-10-CM | POA: Diagnosis not present

## 2023-05-21 ENCOUNTER — Ambulatory Visit: Payer: Self-pay | Admitting: *Deleted

## 2023-05-21 NOTE — Telephone Encounter (Signed)
  Chief Complaint: pimples on her face that come and go and occur in different places. Symptoms: pimples around her lips and nose, then to forehead, cheeks and jaw. Frequency: For the last couple of months Pertinent Negatives: Patient denies N/A Disposition: [] ED /[] Urgent Care (no appt availability in office) / [x] Appointment(In office/virtual)/ []  [place] [facility]/ [] Home Care/ [] Refused Recommended Disposition /[] [place] [facility]/ []  Follow-up with PCP Additional Notes: Appt made with Dr. Yetta Barre for 05/22/2023 at 1:20.

## 2023-05-21 NOTE — Telephone Encounter (Signed)
Reason for Disposition  [1] Pimples (localized) AND [place] ] no improvement after using Care Advice  Answer Assessment - Initial Assessment Questions 1. APPEARANCE of RASH: "Describe the rash."      It looks like pimples around my lips, nose and jawline.   Now it's on my forehead and cheek bones    It comes and goes. 2. LOCATION: "Where is the rash located?"      See above 3. NUMBER: "How many spots are there?"      See above 4. SIZE: "How big are the spots?" (Inches, centimeters or compare to size of a coin)      Not asked 5. ONSET: "When did the rash start?"      For a couple of months.    They get flaky when they are going away. 6. ITCHING: "Does the rash itch?" If Yes, ask: "How bad is the itch?"  (Scale 0-10; or none, mild, moderate, severe)     No    They get sore and tender.   Has a head. 7. PAIN: "Does the rash hurt?" If Yes, ask: "How bad is the pain?"  (Scale 0-10; or none, mild, moderate, severe)    - NONE (0): no pain    - MILD (1-3): doesn't interfere with normal activities     - MODERATE (4-7): interferes with normal activities or awakens from sleep     - SEVERE (8-10): excruciating pain, unable to do any normal activities     No 8. OTHER SYMPTOMS: "Do you have any other symptoms?" (e.g., fever)     No 9. PREGNANCY: "Is there any chance you are pregnant?" "When was your last menstrual period?"     N/A  Protocols used: Rash or Redness - Localized-A-AH

## 2023-05-22 ENCOUNTER — Encounter: Payer: Self-pay | Admitting: Family Medicine

## 2023-05-22 ENCOUNTER — Ambulatory Visit: Payer: Medicare PPO | Admitting: Family Medicine

## 2023-05-22 VITALS — BP 120/79 | HR 66 | Ht 66.0 in | Wt 171.0 lb

## 2023-05-22 DIAGNOSIS — N393 Stress incontinence (female) (male): Secondary | ICD-10-CM | POA: Diagnosis not present

## 2023-05-22 DIAGNOSIS — H01021 Squamous blepharitis right upper eyelid: Secondary | ICD-10-CM

## 2023-05-22 DIAGNOSIS — L219 Seborrheic dermatitis, unspecified: Secondary | ICD-10-CM

## 2023-05-22 DIAGNOSIS — H01024 Squamous blepharitis left upper eyelid: Secondary | ICD-10-CM | POA: Diagnosis not present

## 2023-05-22 MED ORDER — KETOCONAZOLE 2 % EX SHAM: 1.0000 | MEDICATED_SHAMPOO | CUTANEOUS | 0 refills | Status: AC

## 2023-05-22 NOTE — Patient Instructions (Addendum)
Seborrheic Dermatitis, Adult Seborrheic dermatitis is a skin disease that causes red, scaly patches. It often occurs on the scalp, where it may be called dandruff. The patches may also appear on other parts of the body. Skin patches tend to occur where there are a lot of oil glands in the skin. Areas of the body that may be affected include: The scalp. The face, eyebrows, and ears. The area around a beard. Skin folds of the body. This includes the armpits, groin, and buttocks. The chest. The condition is often long-lasting (chronic). It may come and go for no known reason. It may be activated by a trigger, such as: Cold weather. Being out in the sun. Stress. Drinking alcohol. What are the causes? The cause of this condition is not known. It may be related to having too much yeast on the skin or changes in how your body's disease-fighting system (immune system) works. What increases the risk? You may be more likely to develop this condition if: You have a weak immune system. You are 71 years old or older. You have other conditions, such as: Human immunodeficiency virus (HIV) or acquired immunodeficiency virus (AIDS). Parkinson's disease. Mood disorders, such as depression. Liver problems. Obesity. What are the signs or symptoms? Symptoms of this condition include: Thick scales on the scalp. Redness on the face or in the armpits. Skin that is flaky. The flakes may be white or yellow. Skin that seems oily or dry but is not helped with moisturizers. Itching or burning in the affected areas. How is this diagnosed? This condition is diagnosed with a medical history and physical exam. A sample of your skin may be tested (skin biopsy). You may need to see a skin specialist (dermatologist). How is this treated? There is no cure for this condition, but treatment can help to manage the symptoms. You may get treatment to remove scales, lower the risk of skin infection, and reduce swelling or  itching. Treatment may include: Medicated shampoos, moisturizing creams, or ointments. Creams that reduce skin yeast. Creams that reduce swelling and irritation (steroids). Follow these instructions at home: Skin care Use any medicated shampoo, skin creams, or ointments only as told by your health care provider. Do not use skin products that contain alcohol. Take lukewarm baths or showers. Avoid very hot water. When you are outside, wear a hat and clothes that block UV light. General instructions Apply over-the-counter and prescription medicines only as told by your health care provider. Learn what triggers your symptoms so you can avoid these things. Use techniques for stress reduction, such as meditation or yoga. Do not drink alcohol if your health care provider tells you not to drink. Keep all follow-up visits. Your health care provider will check your skin to make sure the treatments are helping. Where to find more information American Academy of Dermatology: [employer] Contact a health care provider if: Your symptoms do not get better with treatment. Your symptoms get worse. You have new symptoms. Get help right away if: Your condition quickly gets worse, even with treatment. This information is not intended to replace advice given to you by your health care provider. Make sure you discuss any questions you have with your health care provider. Document Revised: 04/12/2022 Document Reviewed: 04/12/2022 Elsevier Patient Education  2024 [employer] Kegel Exercises  Kegel exercises can help strengthen your pelvic floor muscles. The pelvic floor is a group of muscles that support your rectum, small intestine, and bladder. In females, pelvic floor muscles also help  support the uterus. These muscles help you control the flow of urine and stool (feces). Kegel exercises are painless and simple. They do not require any equipment. Your provider may suggest Kegel exercises to: Improve bladder  and bowel control. Improve sexual response. Improve weak pelvic floor muscles after surgery to remove the uterus (hysterectomy) or after pregnancy, in females. Improve weak pelvic floor muscles after prostate gland removal or surgery, in males. Kegel exercises involve squeezing your pelvic floor muscles. These are the same muscles you squeeze when you try to stop the flow of urine or keep from passing gas. The exercises can be done while sitting, standing, or lying down, but it is best to vary your position. Ask your health care provider which exercises are safe for you. Do exercises exactly as told by your health care provider and adjust them as directed. Do not begin these exercises until told by your health care provider. Exercises How to do Kegel exercises: Squeeze your pelvic floor muscles tight. You should feel a tight lift in your rectal area. If you are a female, you should also feel a tightness in your vaginal area. Keep your stomach, buttocks, and legs relaxed. Hold the muscles tight for up to 10 seconds. Breathe normally. Relax your muscles for up to 10 seconds. Repeat as told by your health care provider. Repeat this exercise daily as told by your health care provider. Continue to do this exercise for at least 4-6 weeks, or for as long as told by your health care provider. You may be referred to a physical therapist who can help you learn more about how to do Kegel exercises. Depending on your condition, your health care provider may recommend: Varying how long you squeeze your muscles. Doing several sets of exercises every day. Doing exercises for several weeks. Making Kegel exercises a part of your regular exercise routine. This information is not intended to replace advice given to you by your health care provider. Make sure you discuss any questions you have with your health care provider. Document Revised: 03/22/2021 Document Reviewed: 03/22/2021 Elsevier Patient Education   2024 [employer].

## 2023-05-22 NOTE — Progress Notes (Signed)
Date:  05/22/2023   Name:  Angela Sparks   DOB:  06/20/1951   MRN:  [id number]   Chief Complaint: Rash (Pimples around lips, on forehead and chin)  Rash This is a new problem. The current episode started more than 1 month ago. The problem has been waxing and waning since onset. The affected locations include the face. The rash is characterized by redness, itchiness and scaling. She was exposed to nothing. Pertinent negatives include no congestion, cough, diarrhea, fatigue, fever, rhinorrhea, shortness of breath or sore throat. Past treatments include nothing. The treatment provided mild relief.    Lab Results  Component Value Date   NA 143 06/26/2021   K 4.9 06/26/2021   CO2 26 06/26/2021   GLUCOSE 132 (H) 06/26/2021   BUN 11 06/26/2021   CREATININE 1.15 (H) 06/26/2021   CALCIUM 9.5 06/26/2021   EGFR 52 (L) 06/26/2021   GFRNONAA 54 (L) 05/04/2019   Lab Results  Component Value Date   CHOL 295 (H) 06/26/2021   HDL 53 06/26/2021   LDLCALC 185 (H) 06/26/2021   TRIG 294 (H) 06/26/2021   No results found for: "TSH" Lab Results  Component Value Date   HGBA1C 6.2 (H) 06/28/2021   Lab Results  Component Value Date   WBC 7.7 06/26/2021   HGB 14.5 06/26/2021   HCT 44.6 06/26/2021   MCV 91 06/26/2021   PLT 213 06/26/2021   Lab Results  Component Value Date   ALT 26 05/04/2019   AST 25 05/04/2019   ALKPHOS 55 05/04/2019   BILITOT 1.1 05/04/2019   Lab Results  Component Value Date   VD25OH 35.6 06/26/2021     Review of Systems  Constitutional: Negative.  Negative for chills, fatigue, fever and unexpected weight change.  HENT:  Negative for congestion, ear discharge, ear pain, rhinorrhea, sinus pressure, sneezing and sore throat.   Respiratory:  Negative for cough, shortness of breath, wheezing and stridor.   Gastrointestinal:  Negative for abdominal pain, blood in stool, constipation, diarrhea and nausea.  Genitourinary:  Negative for dysuria, flank pain,  frequency, hematuria, urgency and vaginal discharge.  Musculoskeletal:  Negative for arthralgias, back pain and myalgias.  Skin:  Positive for rash.  Neurological:  Negative for dizziness, weakness and headaches.  Hematological:  Negative for adenopathy. Does not bruise/bleed easily.  Psychiatric/Behavioral:  Negative for dysphoric mood. The patient is not nervous/anxious.     Patient Active Problem List   Diagnosis Date Noted   Personal history of venous thrombosis and embolism 11/05/2017   Superficial thrombosis of left lower extremity 11/05/2017   Deep vein thrombosis (DVT) (HCC) 06/29/2014   HLD (hyperlipidemia) 06/29/2014   BP (high blood pressure) 06/29/2014    Allergies  Allergen Reactions   Codeine Other (See Comments)    Visual disturbances   Guaifenesin Other (See Comments)    Hallucinations    Past Surgical History:  Procedure Laterality Date   BREAST BIOPSY Right 10/28/2012   [place]  bx-neg   BREAST BIOPSY Right 10/15/2013   [place] bx/clip-neg   CATARACT EXTRACTION W/PHACO Right 12/02/2022   Procedure: CATARACT EXTRACTION PHACO AND INTRAOCULAR LENS PLACEMENT (IOC) RIGHT  4.46  00:32.6;  Surgeon: Nevada Crane, MD;  Location: [facility];  Service: Ophthalmology;  Laterality: Right;   CATARACT EXTRACTION W/PHACO Left 12/16/2022   Procedure: CATARACT EXTRACTION PHACO AND INTRAOCULAR LENS PLACEMENT (IOC) LEFT  2.55  00:27.4;  Surgeon: Nevada Crane, MD;  Location: [facility];  Service:  Ophthalmology;  Laterality: Left;   COLONOSCOPY  2007   DOPPLER ECHOCARDIOGRAPHY  07/07/2009   NM MYOVIEW LTD  07/07/2009   TONSILLECTOMY     age 72   TRANSTHORACIC ECHOCARDIOGRAM  07/07/2009    Social History   Tobacco Use   Smoking status: Never   Smokeless tobacco: Never   Tobacco comments:    I do not and never have smoked.  Vaping Use   Vaping Use: Never used  Substance Use Topics   Alcohol use: Never   Drug use: Never     Medication list has been  reviewed and updated.  Current Meds  Medication Sig   aspirin EC 81 MG tablet Take 1 tablet by mouth daily.   clopidogrel (PLAVIX) 75 MG tablet Take 1 tablet (75 mg total) by mouth daily. NO MORE REFILLS W/O APPT (Patient taking differently: Take 75 mg by mouth daily. Dr Lady Gary)   esomeprazole (NEXIUM) 20 MG capsule Take 1 capsule by mouth 2 (two) times daily. otc   ezetimibe (ZETIA) 10 MG tablet Take 1 tablet by mouth daily. Dr Nedra Hai   losartan (COZAAR) 50 MG tablet Take 1 tablet (50 mg total) by mouth daily. NO MORE REFILLS W/O APPT (Patient taking differently: Take 50 mg by mouth daily. Dr Lady Gary)   metoprolol succinate (TOPROL XL) 25 MG 24 hr tablet Take 1 tablet (25 mg total) by mouth daily. NO MORE REFILLS W/O APPT (Patient taking differently: Take 25 mg by mouth daily. Fath)   neomycin-polymyxin b-dexamethasone (MAXITROL) 3.5-10000-0.1 OINT King       05/22/2023    1:18 PM 10/05/2020    2:35 PM  GAD 7 : Generalized Anxiety Score  Nervous, Anxious, on Edge 0 0  Control/stop worrying 0 0  Worry too much - different things 0 0  Trouble relaxing 0 0  Restless 0 0  Easily annoyed or irritable 0 0  Afraid - awful might happen 0 0  Total GAD 7 Score 0 0  Anxiety Difficulty Not difficult at all        05/22/2023    1:18 PM 06/19/2022    2:43 PM 06/18/2021    2:54 PM  Depression screen PHQ 2/9  Decreased Interest 0 0 0  Down, Depressed, Hopeless 0 0 0  PHQ - 2 Score 0 0 0  Altered sleeping 0    Tired, decreased energy 0    Change in appetite 0    Feeling bad or failure about yourself  0    Trouble concentrating 0    Moving slowly or fidgety/restless 0    Suicidal thoughts 0    PHQ-9 Score 0    Difficult doing work/chores Not difficult at all      BP Readings from Last 3 Encounters:  05/22/23 120/79  12/16/22 135/64  12/02/22 122/74    Physical Exam Vitals and nursing note reviewed.  HENT:     Head: Normocephalic.     Right Ear: Tympanic membrane normal.     Left Ear:  Tympanic membrane normal.     Mouth/Throat:     Mouth: Mucous membranes are moist.  Eyes:     Comments: Erythema eyelids  Cardiovascular:     Rate and Rhythm: Normal rate.     Heart sounds: Normal heart sounds. No murmur heard.    No gallop.  Pulmonary:     Breath sounds: Normal breath sounds. No wheezing, rhonchi or rales.  Abdominal:     General: Bowel sounds are normal.  Palpations: There is no hepatomegaly or splenomegaly.  Skin:    Findings: Erythema present.     Comments: Erythema, scaly forehead eyebrows.  Nasal malar areas and below the lower lip consistent with seborrheic dermatitis.  Will treat initially with Nizoral 2% shampoo daily on maintenance couple times a weeks.  Neurological:     Mental Status: She is alert.     Wt Readings from Last 3 Encounters:  05/22/23 171 lb (77.6 kg)  12/16/22 177 lb (80.3 kg)  12/02/22 175 lb (79.4 kg)    BP 120/79   Pulse 66   Ht 5\' 6"  (1.676 m)   Wt 171 lb (77.6 kg)   SpO2 96%   BMI 27.60 kg/m   Assessment and Plan:  1. Seborrheic dermatitis New onset.  But now chronic for several months involving the facial area primarily.  We will initiate treatment with 2% ketoconazole shampoo leaving on for 1 to 2 minutes and rinsing well.  Of also suggested using a set of upon arising in the morning in case there is some auto digestive event due to mouth breathing and there is salivary enzymes that may be involved in the morning. - ketoconazole (NIZORAL) 2 % shampoo; Apply 1 Application topically 2 (two) times a week.  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  This is similar to what is going on with the facial area and we will control with the shampoo in this regard.  3. Primary stress urinary incontinence Patient also finding that there is some incomplete emptying of the bladder which may be 6  secondary to urinary stress incontinence and we will initiate Kegel exercises at home and if continued patient will call and we will refer to urology.   Elizabeth Sauer, MD

## 2023-06-25 ENCOUNTER — Ambulatory Visit (INDEPENDENT_AMBULATORY_CARE_PROVIDER_SITE_OTHER): Payer: Medicare PPO

## 2023-06-25 VITALS — Ht 66.0 in | Wt 171.0 lb

## 2023-06-25 DIAGNOSIS — Z0000 Encounter for general adult medical examination without abnormal findings: Secondary | ICD-10-CM

## 2023-06-25 DIAGNOSIS — Z1231 Encounter for screening mammogram for malignant neoplasm of breast: Secondary | ICD-10-CM

## 2023-06-25 NOTE — Progress Notes (Cosign Needed Addendum)
Subjective:   Angela Sparks is a 72 y.o. female who presents for Medicare Annual (Subsequent) preventive examination.  Visit Complete: Virtual  I connected with  Angela Sparks on 06/25/23 by a audio enabled telemedicine application and verified that I am speaking with the correct person using two identifiers.  Patient Location: Home  Provider Location: Office/Clinic  I discussed the limitations of evaluation and management by telemedicine. The patient expressed understanding and agreed to proceed.  Vital Signs: Patient was unable to self-report vital signs via telehealth due to a lack of equipment at home.   Review of Systems     Cardiac Risk Factors include: advanced age (>33men, >42 women);hypertension;sedentary lifestyle     Objective:    Today's Vitals   06/25/23 1319  Weight: 171 lb (77.6 kg)  Height: 5\' 6"  (1.676 m)   Body mass index is 27.6 kg/m.     06/25/2023    1:08 PM 12/16/2022    9:17 AM 06/18/2021    2:57 PM 05/06/2019   11:41 AM 11/04/2018   11:12 AM 05/06/2018   11:11 AM 02/04/2018   10:20 AM  Advanced Directives  Does Patient Have a Medical Advance Directive? No No No No No No No  Would patient like information on creating a medical advance directive? No - Patient declined No - Patient declined No - Patient declined  No - Patient declined No - Patient declined     Current Medications (verified) Outpatient Encounter Medications as of 06/25/2023  Medication Sig   aspirin EC 81 MG tablet Take 1 tablet by mouth daily.   clopidogrel (PLAVIX) 75 MG tablet Take 1 tablet (75 mg total) by mouth daily. NO MORE REFILLS W/O APPT (Patient taking differently: Take 75 mg by mouth daily. Dr Lady Gary)   esomeprazole (NEXIUM) 20 MG capsule Take 1 capsule by mouth 2 (two) times daily. otc   ezetimibe (ZETIA) 10 MG tablet Take 1 tablet by mouth daily. Dr Nedra Hai   ketoconazole (NIZORAL) 2 % shampoo Apply 1 Application topically 2 (two) times a week.   losartan (COZAAR) 50 MG  tablet Take 1 tablet (50 mg total) by mouth daily. NO MORE REFILLS W/O APPT (Patient taking differently: Take 50 mg by mouth daily. Dr Lady Gary)   metoprolol succinate (TOPROL XL) 25 MG 24 hr tablet Take 1 tablet (25 mg total) by mouth daily. NO MORE REFILLS W/O APPT (Patient taking differently: Take 25 mg by mouth daily. Fath)   neomycin-polymyxin b-dexamethasone (MAXITROL) 3.5-10000-0.1 OINT King   No facility-administered encounter medications on file as of 06/25/2023.    Allergies (verified) Codeine, Guaifenesin, and Statins   History: Past Medical History:  Diagnosis Date   Allergy    Atrial fibrillation (HCC)    DVT (deep venous thrombosis) (HCC)    before 2015   GERD (gastroesophageal reflux disease)    I have had acid reflux for many years now.   Heart murmur    Was diagnosed when I was a child. But was told I outgrew it.   Hyperlipidemia    Hypertension    Multiple thyroid nodules    PE (pulmonary embolism)    before 2015   Vertigo    Past Surgical History:  Procedure Laterality Date   BREAST BIOPSY Right 10/28/2012   [place]  bx-neg   BREAST BIOPSY Right 10/15/2013   [place] bx/clip-neg   CATARACT EXTRACTION W/PHACO Right 12/02/2022   Procedure: CATARACT EXTRACTION PHACO AND INTRAOCULAR LENS PLACEMENT (IOC) RIGHT  4.46  00:32.6;  Surgeon: Nevada Crane, MD;  Location: [facility];  Service: Ophthalmology;  Laterality: Right;   CATARACT EXTRACTION W/PHACO Left 12/16/2022   Procedure: CATARACT EXTRACTION PHACO AND INTRAOCULAR LENS PLACEMENT (IOC) LEFT  2.55  00:27.4;  Surgeon: Nevada Crane, MD;  Location: [facility];  Service: Ophthalmology;  Laterality: Left;   COLONOSCOPY  2007   DOPPLER ECHOCARDIOGRAPHY  07/07/2009   NM MYOVIEW LTD  07/07/2009   TONSILLECTOMY     age 18   TRANSTHORACIC ECHOCARDIOGRAM  07/07/2009   Family History  Problem Relation Age of Onset   Diabetes Mother    Thyroid disease Mother    Stroke Mother    CAD Father    Breast  cancer Neg Hx    Social History   Socioeconomic History   Marital status: Married    Spouse name: Angela Sparks   Number of children: 3   Years of education: Not on file   Highest education level: Some college, no degree  Occupational History   Occupation: Retired  Tobacco Use   Smoking status: Never   Smokeless tobacco: Never   Tobacco comments:    I do not and never have smoked.  Vaping Use   Vaping status: Never Used  Substance and Sexual Activity   Alcohol use: Never   Drug use: Never   Sexual activity: Not on file  Other Topics Concern   Not on file  Social History Narrative   Not on file   Social Determinants of Health   Financial Resource Strain: Low Risk  (06/25/2023)   Overall Financial Resource Strain (CARDIA)    Difficulty of Paying Living Expenses: Not hard at all  Food Insecurity: No Food Insecurity (06/25/2023)   Hunger Vital Sign    Worried About Running Out of Food in the Last Year: Never true    Ran Out of Food in the Last Year: Never true  Recent Concern: Food Insecurity - Food Insecurity Present (05/21/2023)   Hunger Vital Sign    Worried About Running Out of Food in the Last Year: Sometimes true    Ran Out of Food in the Last Year: Never true  Transportation Needs: No Transportation Needs (06/25/2023)   PRAPARE - Administrator, Civil Service (Medical): No    Lack of Transportation (Non-Medical): No  Physical Activity: Inactive (06/25/2023)   Exercise Vital Sign    Days of Exercise per Week: 0 days    Minutes of Exercise per Session: 0 min  Stress: No Stress Concern Present (06/25/2023)   [employer] of Occupational Health - Occupational Stress Questionnaire    Feeling of Stress : Only a little  Social Connections: Moderately Integrated (06/25/2023)   Social Connection and Isolation Panel [NHANES]    Frequency of Communication with Friends and Family: More than three times a week    Frequency of Social Gatherings with Friends and  Family: Once a week    Attends Religious Services: More than 4 times per year    Active Member of [employer] or Organizations: No    Attends Banker Meetings: Never    Marital Status: Married    Tobacco Counseling Counseling given: Not Answered Tobacco comments: I do not and never have smoked.   Clinical Intake:  Pre-visit preparation completed: Yes  Pain : No/denies pain     Nutritional Risks: None Diabetes: No  How often do you need to have someone help you when you read instructions, pamphlets, or other written materials  from your doctor or pharmacy?: 1 - Never  Interpreter Needed?: No  Information entered by :: Kennedy Bucker, LPN   Activities of Daily Living    06/25/2023    1:09 PM  In your present state of health, do you have any difficulty performing the following activities:  Hearing? 0  Vision? 0  Difficulty concentrating or making decisions? 0  Walking or climbing stairs? 0  Dressing or bathing? 0  Doing errands, shopping? 0  Preparing Food and eating ? N  Using the Toilet? N  In the past six months, have you accidently leaked urine? N  Do you have problems with loss of bowel control? N  Managing your Medications? N  Managing your Finances? N  Housekeeping or managing your Housekeeping? N    Patient Care Team: Duanne Limerick, MD as PCP - General (Family Medicine)  Indicate any recent Medical Services you may have received from other than [facility] providers in the past year (date may be approximate).     Assessment:   This is a routine wellness examination for Chivonne.  Hearing/Vision screen Hearing Screening - Comments:: No aids Vision Screening - Comments:: Readers, cataract sgy- Dr.King   Dietary issues and exercise activities discussed:     Goals Addressed             This Visit's Progress    DIET - EAT MORE FRUITS AND VEGETABLES         Depression Screen    06/25/2023    1:06 PM 05/22/2023    1:18 PM 06/19/2022    2:43  PM 06/18/2021    2:54 PM 10/05/2020    2:35 PM 10/28/2017   11:05 AM 10/28/2017   11:03 AM  PHQ 2/9 Scores  PHQ - 2 Score 0 0 0 0 0 0 0  PHQ- 9 Score 0 0   0 1     Fall Risk    06/25/2023    1:09 PM 05/22/2023    1:18 PM 06/19/2022    2:45 PM 06/18/2021    3:01 PM 10/05/2020    2:35 PM  Fall Risk   Falls in the past year? 0 0 0 0 0  Number falls in past yr: 0 0 0 0   Injury with Fall? 0 0 0 0   Risk for fall due to : No Fall Risks No Fall Risks No Fall Risks No Fall Risks   Follow up Falls prevention discussed;Falls evaluation completed Falls evaluation completed Falls evaluation completed  Falls evaluation completed    MEDICARE RISK AT HOME:  Medicare Risk at Home - 06/25/23 1310     Any stairs in or around the home? No    If so, are there any without handrails? No    Home free of loose throw rugs in walkways, pet beds, electrical cords, etc? Yes    Adequate lighting in your home to reduce risk of falls? Yes    Life alert? No    Use of a cane, walker or w/c? No    Grab bars in the bathroom? No    Shower chair or bench in shower? No    Elevated toilet seat or a handicapped toilet? No             TIMED UP AND GO:  Was the test performed?  No    Cognitive Function:        06/25/2023    1:11 PM 06/19/2022    2:45 PM  6CIT Screen  What Year? 0 points 0 points  What month? 0 points 0 points  What time? 0 points 0 points  Count back from 20 0 points 0 points  Months in reverse 0 points 0 points  Repeat phrase 0 points 0 points  Total Score 0 points 0 points    Immunizations Immunization History  Administered Date(s) Administered   Fluad Quad(high Dose 65+) 07/31/2019   Influenza, High Dose Seasonal PF 10/04/2017, 08/16/2021   PNEUMOCOCCAL CONJUGATE-20 06/18/2021   Tdap 12/01/2017    TDAP status: Up to date  Flu Vaccine status: Declined, Education has been provided regarding the importance of this vaccine but patient still declined. Advised may receive this  vaccine at local pharmacy or Health Dept. Aware to provide a copy of the vaccination record if obtained from local pharmacy or Health Dept. Verbalized acceptance and understanding.  Pneumococcal vaccine status: Up to date  Covid-19 vaccine status: Declined, Education has been provided regarding the importance of this vaccine but patient still declined. Advised may receive this vaccine at local pharmacy or Health Dept.or vaccine clinic. Aware to provide a copy of the vaccination record if obtained from local pharmacy or Health Dept. Verbalized acceptance and understanding.  Qualifies for Shingles Vaccine? Yes   Zostavax completed No   Shingrix Completed?: No.    Education has been provided regarding the importance of this vaccine. Patient has been advised to call insurance company to determine out of pocket expense if they have not yet received this vaccine. Advised may also receive vaccine at local pharmacy or Health Dept. Verbalized acceptance and understanding.  Screening Tests Health Maintenance  Topic Date Due   COVID-19 Vaccine (1 - 2023-24 season) Never done   Zoster Vaccines- Shingrix (1 of 2) 08/22/2023 (Originally 12/15/2001)   INFLUENZA VACCINE  06/26/2023   Medicare Annual Wellness (AWV)  06/24/2024   MAMMOGRAM  10/23/2024   DTaP/Tdap/Td (2 - Td or Tdap) 12/02/2027   Pneumonia Vaccine 86+ Years old  Completed   DEXA SCAN  Completed   HPV VACCINES  Aged Out   Colonoscopy  Discontinued   Hepatitis C Screening  Discontinued    Health Maintenance  Health Maintenance Due  Topic Date Due   COVID-19 Vaccine (1 - 2023-24 season) Never done    Declined referral for colonoscopy  Mammogram status: Completed 10/23/22. Repeat every year  Bone Density status: Completed 07/02/21. Results reflect: Bone density results: OSTEOPENIA. Repeat every 5 years.  Lung Cancer Screening: (Low Dose CT Chest recommended if Age 73-80 years, 20 pack-year currently smoking OR have quit w/in 15years.)  does not qualify.   Additional Screening:  Hepatitis C Screening: does qualify; Completed no  Vision Screening: Recommended annual ophthalmology exams for early detection of glaucoma and other disorders of the eye. Is the patient up to date with their annual eye exam?  Yes  Who is the provider or what is the name of the office in which the patient attends annual eye exams? Dr.King If pt is not established with a provider, would they like to be referred to a provider to establish care? No .   Dental Screening: Recommended annual dental exams for proper oral hygiene   Community Resource Referral / Chronic Care Management: CRR required this visit?  No   CCM required this visit?  No     Plan:     I have personally reviewed and noted the following in the patient's chart:   Medical and social history Use of alcohol, tobacco or illicit drugs  Current medications and supplements including opioid prescriptions. Patient is not currently taking opioid prescriptions. Functional ability and status Nutritional status Physical activity Advanced directives List of other physicians Hospitalizations, surgeries, and ER visits in previous 12 months Vitals Screenings to include cognitive, depression, and falls Referrals and appointments  In addition, I have reviewed and discussed with patient certain preventive protocols, quality metrics, and best practice recommendations. A written personalized care plan for preventive services as well as general preventive health recommendations were provided to patient.     Hal Hope, LPN   [id number]/56/4332   After Visit Summary: (MyChart) Due to this being a telephonic visit, the after visit summary with patients personalized plan was offered to patient via MyChart   Nurse Notes: none

## 2023-06-25 NOTE — Patient Instructions (Addendum)
Angela Sparks , Thank you for taking time to come for your Medicare Wellness Visit. I appreciate your ongoing commitment to your health goals. Please review the following plan we discussed and let me know if I can assist you in the future.   Referrals/Orders/Follow-Ups/Clinician Recommendations: none  This is a list of the screening recommended for you and due dates:  Health Maintenance  Topic Date Due   COVID-19 Vaccine (1 - 2023-24 season) Never done   Zoster (Shingles) Vaccine (1 of 2) 08/22/2023*   Flu Shot  06/26/2023   Medicare Annual Wellness Visit  06/24/2024   Mammogram  10/23/2024   DTaP/Tdap/Td vaccine (2 - Td or Tdap) 12/02/2027   Pneumonia Vaccine  Completed   DEXA scan (bone density measurement)  Completed   HPV Vaccine  Aged Out   Colon Cancer Screening  Discontinued   Hepatitis C Screening  Discontinued  *Topic was postponed. The date shown is not the original due date.    Advanced directives: (Declined) Advance directive discussed with you today. Even though you declined this today, please call our office should you change your mind, and we can give you the proper paperwork for you to fill out.  Next Medicare Annual Wellness Visit scheduled for next year: Yes  06/30/24 @ 10:15 am by phone  Preventive Care 65 Years and Older, Female Preventive care refers to lifestyle choices and visits with your health care provider that can promote health and wellness. What does preventive care include? A yearly physical exam. This is also called an annual well check. Dental exams once or twice a year. Routine eye exams. Ask your health care provider how often you should have your eyes checked. Personal lifestyle choices, including: Daily care of your teeth and gums. Regular physical activity. Eating a healthy diet. Avoiding tobacco and drug use. Limiting alcohol use. Practicing safe sex. Taking low-dose aspirin every day. Taking vitamin and mineral supplements as recommended by  your health care provider. What happens during an annual well check? The services and screenings done by your health care provider during your annual well check will depend on your age, overall health, lifestyle risk factors, and family history of disease. Counseling  Your health care provider may ask you questions about your: Alcohol use. Tobacco use. Drug use. Emotional well-being. Home and relationship well-being. Sexual activity. Eating habits. History of falls. Memory and ability to understand (cognition). Work and work Astronomer. Reproductive health. Screening  You may have the following tests or measurements: Height, weight, and BMI. Blood pressure. Lipid and cholesterol levels. These may be checked every 5 years, or more frequently if you are over 80 years old. Skin check. Lung cancer screening. You may have this screening every year starting at age 58 if you have a 30-pack-year history of smoking and currently smoke or have quit within the past 15 years. Fecal occult blood test (FOBT) of the stool. You may have this test every year starting at age 19. Flexible sigmoidoscopy or colonoscopy. You may have a sigmoidoscopy every 5 years or a colonoscopy every 10 years starting at age 29. Hepatitis C blood test. Hepatitis B blood test. Sexually transmitted disease (STD) testing. Diabetes screening. This is done by checking your blood sugar (glucose) after you have not eaten for a while (fasting). You may have this done every 1-3 years. Bone density scan. This is done to screen for osteoporosis. You may have this done starting at age 51. Mammogram. This may be done every 1-2 years. Talk  to your health care provider about how often you should have regular mammograms. Talk with your health care provider about your test results, treatment options, and if necessary, the need for more tests. Vaccines  Your health care provider may recommend certain vaccines, such as: Influenza  vaccine. This is recommended every year. Tetanus, diphtheria, and acellular pertussis (Tdap, Td) vaccine. You may need a Td booster every 10 years. Zoster vaccine. You may need this after age 30. Pneumococcal 13-valent conjugate (PCV13) vaccine. One dose is recommended after age 48. Pneumococcal polysaccharide (PPSV23) vaccine. One dose is recommended after age 53. Talk to your health care provider about which screenings and vaccines you need and how often you need them. This information is not intended to replace advice given to you by your health care provider. Make sure you discuss any questions you have with your health care provider. Document Released: 12/08/2015 Document Revised: 07/31/2016 Document Reviewed: 09/12/2015 Elsevier Interactive Patient Education  2017 [employer].  Fall Prevention in the Home Falls can cause injuries. They can happen to people of all ages. There are many things you can do to make your home safe and to help prevent falls. What can I do on the outside of my home? Regularly fix the edges of walkways and driveways and fix any cracks. Remove anything that might make you trip as you walk through a door, such as a raised step or threshold. Trim any bushes or trees on the path to your home. Use bright outdoor lighting. Clear any walking paths of anything that might make someone trip, such as rocks or tools. Regularly check to see if handrails are loose or broken. Make sure that both sides of any steps have handrails. Any raised decks and porches should have guardrails on the edges. Have any leaves, snow, or ice cleared regularly. Use sand or salt on walking paths during winter. Clean up any spills in your garage right away. This includes oil or grease spills. What can I do in the bathroom? Use night lights. Install grab bars by the toilet and in the tub and shower. Do not use towel bars as grab bars. Use non-skid mats or decals in the tub or shower. If you  need to sit down in the shower, use a plastic, non-slip stool. Keep the floor dry. Clean up any water that spills on the floor as soon as it happens. Remove soap buildup in the tub or shower regularly. Attach bath mats securely with double-sided non-slip rug tape. Do not have throw rugs and other things on the floor that can make you trip. What can I do in the bedroom? Use night lights. Make sure that you have a light by your bed that is easy to reach. Do not use any sheets or blankets that are too big for your bed. They should not hang down onto the floor. Have a firm chair that has side arms. You can use this for support while you get dressed. Do not have throw rugs and other things on the floor that can make you trip. What can I do in the kitchen? Clean up any spills right away. Avoid walking on wet floors. Keep items that you use a lot in easy-to-reach places. If you need to reach something above you, use a strong step stool that has a grab bar. Keep electrical cords out of the way. Do not use floor polish or wax that makes floors slippery. If you must use wax, use non-skid floor wax. Do  not have throw rugs and other things on the floor that can make you trip. What can I do with my stairs? Do not leave any items on the stairs. Make sure that there are handrails on both sides of the stairs and use them. Fix handrails that are broken or loose. Make sure that handrails are as long as the stairways. Check any carpeting to make sure that it is firmly attached to the stairs. Fix any carpet that is loose or worn. Avoid having throw rugs at the top or bottom of the stairs. If you do have throw rugs, attach them to the floor with carpet tape. Make sure that you have a light switch at the top of the stairs and the bottom of the stairs. If you do not have them, ask someone to add them for you. What else can I do to help prevent falls? Wear shoes that: Do not have high heels. Have rubber  bottoms. Are comfortable and fit you well. Are closed at the toe. Do not wear sandals. If you use a stepladder: Make sure that it is fully opened. Do not climb a closed stepladder. Make sure that both sides of the stepladder are locked into place. Ask someone to hold it for you, if possible. Clearly mark and make sure that you can see: Any grab bars or handrails. First and last steps. Where the edge of each step is. Use tools that help you move around (mobility aids) if they are needed. These include: Canes. Walkers. Scooters. Crutches. Turn on the lights when you go into a dark area. Replace any light bulbs as soon as they burn out. Set up your furniture so you have a clear path. Avoid moving your furniture around. If any of your floors are uneven, fix them. If there are any pets around you, be aware of where they are. Review your medicines with your doctor. Some medicines can make you feel dizzy. This can increase your chance of falling. Ask your doctor what other things that you can do to help prevent falls. This information is not intended to replace advice given to you by your health care provider. Make sure you discuss any questions you have with your health care provider. Document Released: 09/07/2009 Document Revised: 04/18/2016 Document Reviewed: 12/16/2014 Elsevier Interactive Patient Education  2017 [employer].

## 2023-07-24 DIAGNOSIS — I10 Essential (primary) hypertension: Secondary | ICD-10-CM | POA: Diagnosis not present

## 2023-07-24 DIAGNOSIS — I4891 Unspecified atrial fibrillation: Secondary | ICD-10-CM | POA: Diagnosis not present

## 2023-07-24 DIAGNOSIS — E785 Hyperlipidemia, unspecified: Secondary | ICD-10-CM | POA: Diagnosis not present

## 2023-08-12 DIAGNOSIS — I4891 Unspecified atrial fibrillation: Secondary | ICD-10-CM | POA: Diagnosis not present

## 2023-08-14 DIAGNOSIS — I4891 Unspecified atrial fibrillation: Secondary | ICD-10-CM | POA: Diagnosis not present

## 2023-08-19 DIAGNOSIS — I82409 Acute embolism and thrombosis of unspecified deep veins of unspecified lower extremity: Secondary | ICD-10-CM | POA: Diagnosis not present

## 2023-08-19 DIAGNOSIS — I4891 Unspecified atrial fibrillation: Secondary | ICD-10-CM | POA: Diagnosis not present

## 2023-08-19 DIAGNOSIS — E7849 Other hyperlipidemia: Secondary | ICD-10-CM | POA: Diagnosis not present

## 2023-08-19 DIAGNOSIS — I10 Essential (primary) hypertension: Secondary | ICD-10-CM | POA: Diagnosis not present

## 2023-10-08 DIAGNOSIS — B372 Candidiasis of skin and nail: Secondary | ICD-10-CM | POA: Diagnosis not present

## 2023-10-08 DIAGNOSIS — L218 Other seborrheic dermatitis: Secondary | ICD-10-CM | POA: Diagnosis not present

## 2023-10-08 DIAGNOSIS — L738 Other specified follicular disorders: Secondary | ICD-10-CM | POA: Diagnosis not present

## 2023-10-08 DIAGNOSIS — L718 Other rosacea: Secondary | ICD-10-CM | POA: Diagnosis not present

## 2023-10-27 ENCOUNTER — Ambulatory Visit
Admission: RE | Admit: 2023-10-27 | Discharge: 2023-10-27 | Disposition: A | Discharge status: Home / Self Care | Payer: Medicare PPO | Source: Ambulatory Visit | Attending: Family Medicine | Admitting: Family Medicine

## 2023-10-27 DIAGNOSIS — Z1231 Encounter for screening mammogram for malignant neoplasm of breast: Secondary | ICD-10-CM | POA: Diagnosis not present

## 2023-11-10 DIAGNOSIS — I4891 Unspecified atrial fibrillation: Secondary | ICD-10-CM | POA: Diagnosis not present

## 2023-11-10 DIAGNOSIS — I10 Essential (primary) hypertension: Secondary | ICD-10-CM | POA: Diagnosis not present

## 2023-11-10 DIAGNOSIS — E7849 Other hyperlipidemia: Secondary | ICD-10-CM | POA: Diagnosis not present

## 2023-11-10 DIAGNOSIS — Z789 Other specified health status: Secondary | ICD-10-CM | POA: Diagnosis not present

## 2023-11-27 DIAGNOSIS — L718 Other rosacea: Secondary | ICD-10-CM | POA: Diagnosis not present

## 2023-11-27 DIAGNOSIS — L570 Actinic keratosis: Secondary | ICD-10-CM | POA: Diagnosis not present

## 2024-01-13 DIAGNOSIS — H26493 Other secondary cataract, bilateral: Secondary | ICD-10-CM | POA: Diagnosis not present

## 2024-01-13 DIAGNOSIS — H02883 Meibomian gland dysfunction of right eye, unspecified eyelid: Secondary | ICD-10-CM | POA: Diagnosis not present

## 2024-01-13 DIAGNOSIS — Z0100 Encounter for examination of eyes and vision without abnormal findings: Secondary | ICD-10-CM | POA: Diagnosis not present

## 2024-01-13 DIAGNOSIS — Z961 Presence of intraocular lens: Secondary | ICD-10-CM | POA: Diagnosis not present

## 2024-03-06 LAB — COLOGUARD: COLOGUARD: NEGATIVE

## 2024-06-30 ENCOUNTER — Ambulatory Visit: Payer: Medicare PPO

## 2024-09-13 ENCOUNTER — Other Ambulatory Visit: Payer: Self-pay | Admitting: Family Medicine

## 2024-09-13 DIAGNOSIS — Z1231 Encounter for screening mammogram for malignant neoplasm of breast: Secondary | ICD-10-CM

## 2024-10-27 ENCOUNTER — Ambulatory Visit
Admission: RE | Admit: 2024-10-27 | Discharge: 2024-10-27 | Disposition: A | Discharge status: Home / Self Care | Source: Ambulatory Visit | Attending: Family Medicine | Admitting: Family Medicine

## 2024-10-27 DIAGNOSIS — Z1231 Encounter for screening mammogram for malignant neoplasm of breast: Secondary | ICD-10-CM | POA: Diagnosis present
